# Patient Record
Sex: Male | Born: 1960 | Race: White | Hispanic: No | Marital: Single | Smoking: Never smoker
Health system: Southern US, Community
[De-identification: ages and names within clinical notes are randomized; demographics above are authoritative.]

## PROBLEM LIST (undated history)

## (undated) DIAGNOSIS — N183 Chronic kidney disease, stage 3 unspecified: Secondary | ICD-10-CM

## (undated) DIAGNOSIS — E785 Hyperlipidemia, unspecified: Secondary | ICD-10-CM

## (undated) DIAGNOSIS — I255 Ischemic cardiomyopathy: Secondary | ICD-10-CM

## (undated) DIAGNOSIS — I10 Essential (primary) hypertension: Secondary | ICD-10-CM

## (undated) DIAGNOSIS — I5042 Chronic combined systolic (congestive) and diastolic (congestive) heart failure: Secondary | ICD-10-CM

## (undated) DIAGNOSIS — I2510 Atherosclerotic heart disease of native coronary artery without angina pectoris: Secondary | ICD-10-CM

## (undated) HISTORY — DX: Atherosclerotic heart disease of native coronary artery without angina pectoris: I25.10

## (undated) HISTORY — DX: Ischemic cardiomyopathy: I25.5

## (undated) HISTORY — DX: Hyperlipidemia, unspecified: E78.5

## (undated) HISTORY — DX: Chronic kidney disease, stage 3 unspecified: N18.30

## (undated) HISTORY — PX: VEIN BYPASS SURGERY: SHX833

## (undated) HISTORY — PX: OTHER SURGICAL HISTORY: SHX169

## (undated) HISTORY — DX: Chronic kidney disease, stage 3 (moderate): N18.3

## (undated) HISTORY — DX: Essential (primary) hypertension: I10

---

## 1990-05-06 HISTORY — PX: KIDNEY SURGERY: SHX687

## 1997-11-15 ENCOUNTER — Emergency Department (HOSPITAL_COMMUNITY): Admission: EM | Admit: 1997-11-15 | Discharge: 1997-11-15 | Payer: Self-pay | Admitting: Emergency Medicine

## 1998-10-12 ENCOUNTER — Inpatient Hospital Stay (HOSPITAL_COMMUNITY): Admission: EM | Admit: 1998-10-12 | Discharge: 1998-10-13 | Payer: Self-pay | Admitting: Emergency Medicine

## 1998-10-12 ENCOUNTER — Encounter: Payer: Self-pay | Admitting: Emergency Medicine

## 1998-10-13 ENCOUNTER — Encounter: Payer: Self-pay | Admitting: Cardiology

## 2005-01-14 ENCOUNTER — Other Ambulatory Visit: Payer: Self-pay

## 2005-01-14 ENCOUNTER — Emergency Department: Payer: Self-pay | Admitting: Emergency Medicine

## 2007-05-07 HISTORY — PX: CARDIAC CATHETERIZATION: SHX172

## 2007-07-07 ENCOUNTER — Inpatient Hospital Stay: Payer: Self-pay | Admitting: *Deleted

## 2007-07-08 ENCOUNTER — Inpatient Hospital Stay (HOSPITAL_COMMUNITY): Admission: AD | Admit: 2007-07-08 | Discharge: 2007-07-14 | Payer: Self-pay | Admitting: Cardiology

## 2007-07-09 ENCOUNTER — Ambulatory Visit: Payer: Self-pay | Admitting: Thoracic Surgery (Cardiothoracic Vascular Surgery)

## 2007-08-25 ENCOUNTER — Encounter
Admission: RE | Admit: 2007-08-25 | Discharge: 2007-08-25 | Payer: Self-pay | Admitting: Thoracic Surgery (Cardiothoracic Vascular Surgery)

## 2007-08-25 ENCOUNTER — Ambulatory Visit: Payer: Self-pay | Admitting: Thoracic Surgery (Cardiothoracic Vascular Surgery)

## 2008-06-17 IMAGING — CR DG CHEST 2V
2 series · 2 of 2 positions shown · non-contrast
Comparison: 07/11/2007

CLINICAL DATA: Unstable angina.  CABG.

CHEST - 2 VIEW

[w chest pa]
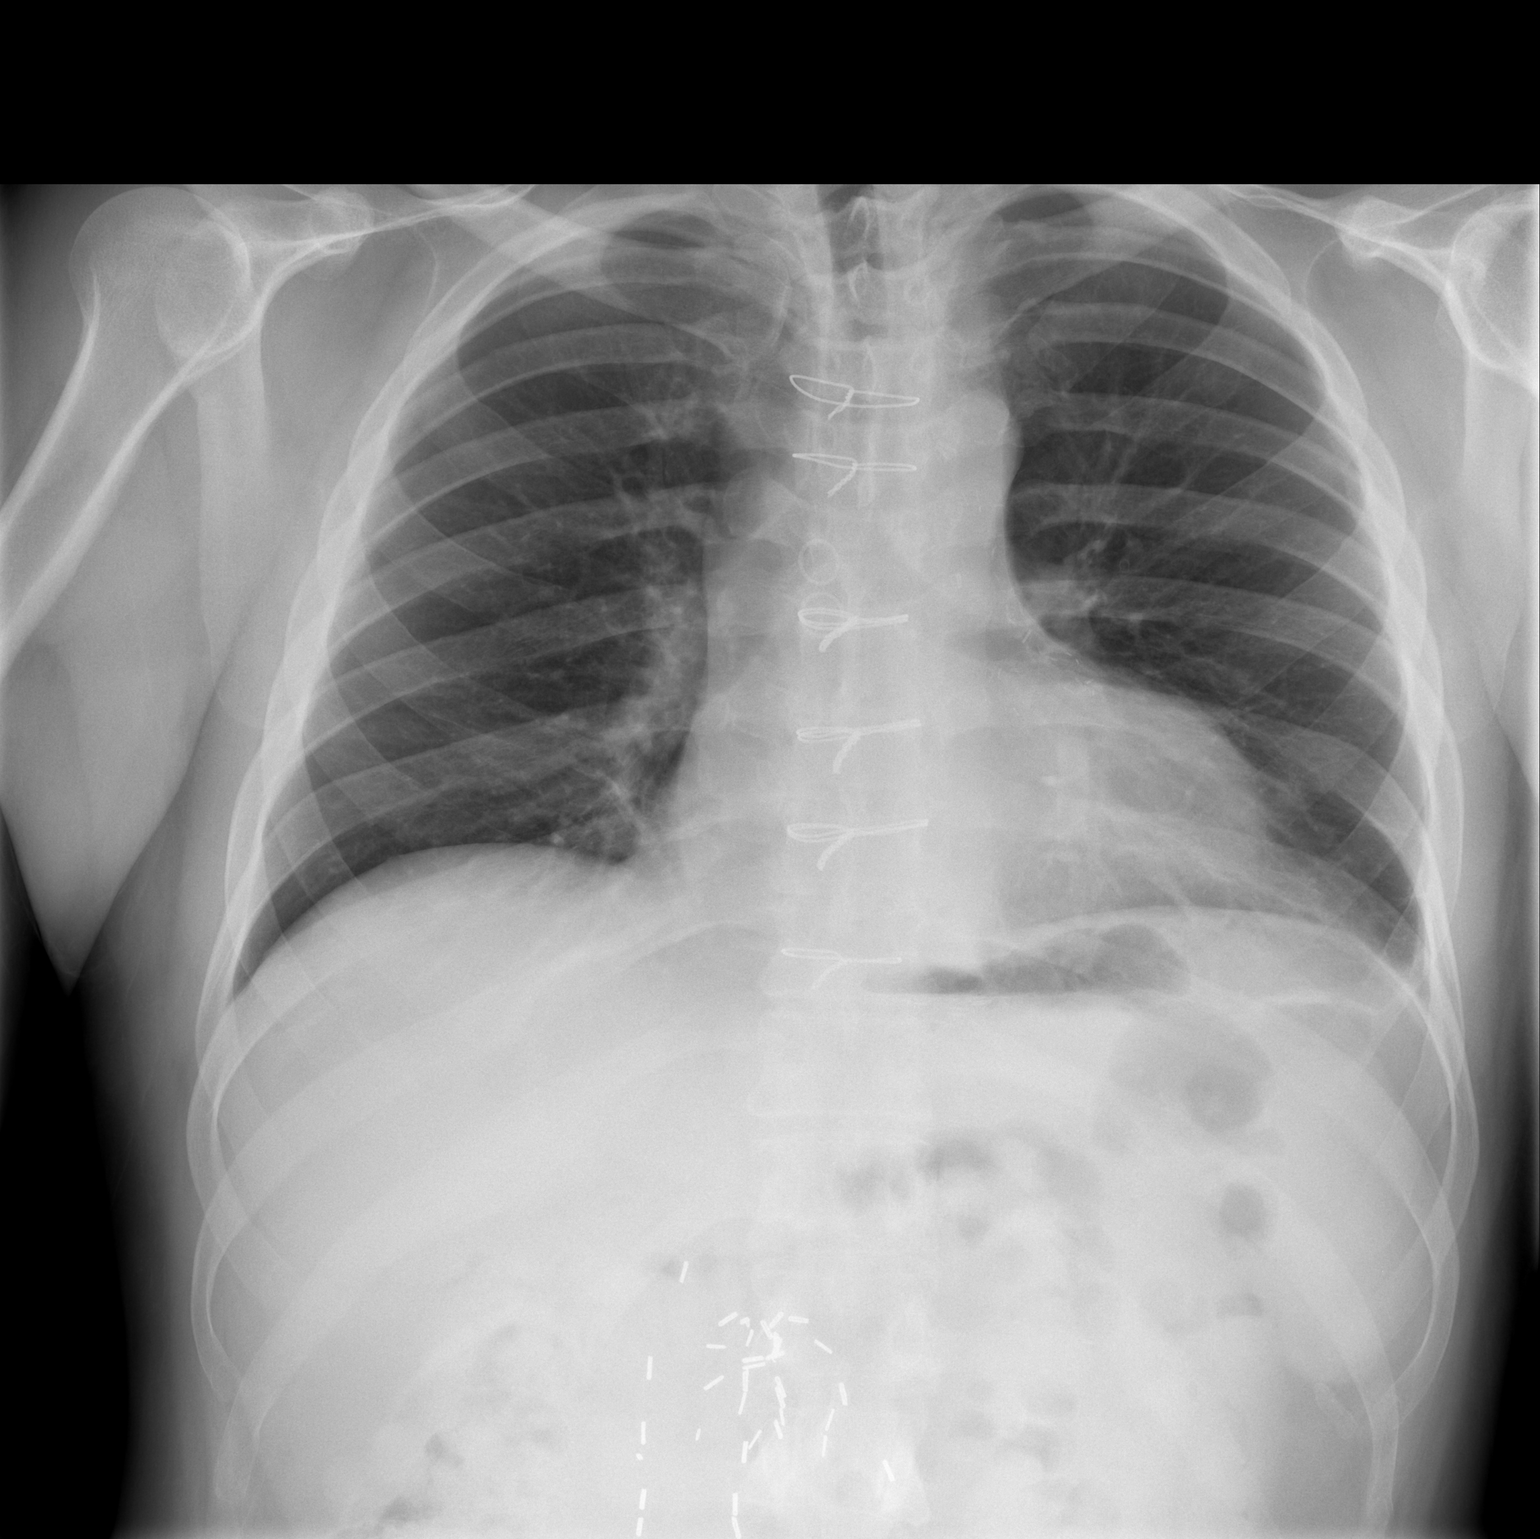

[w chest lat]
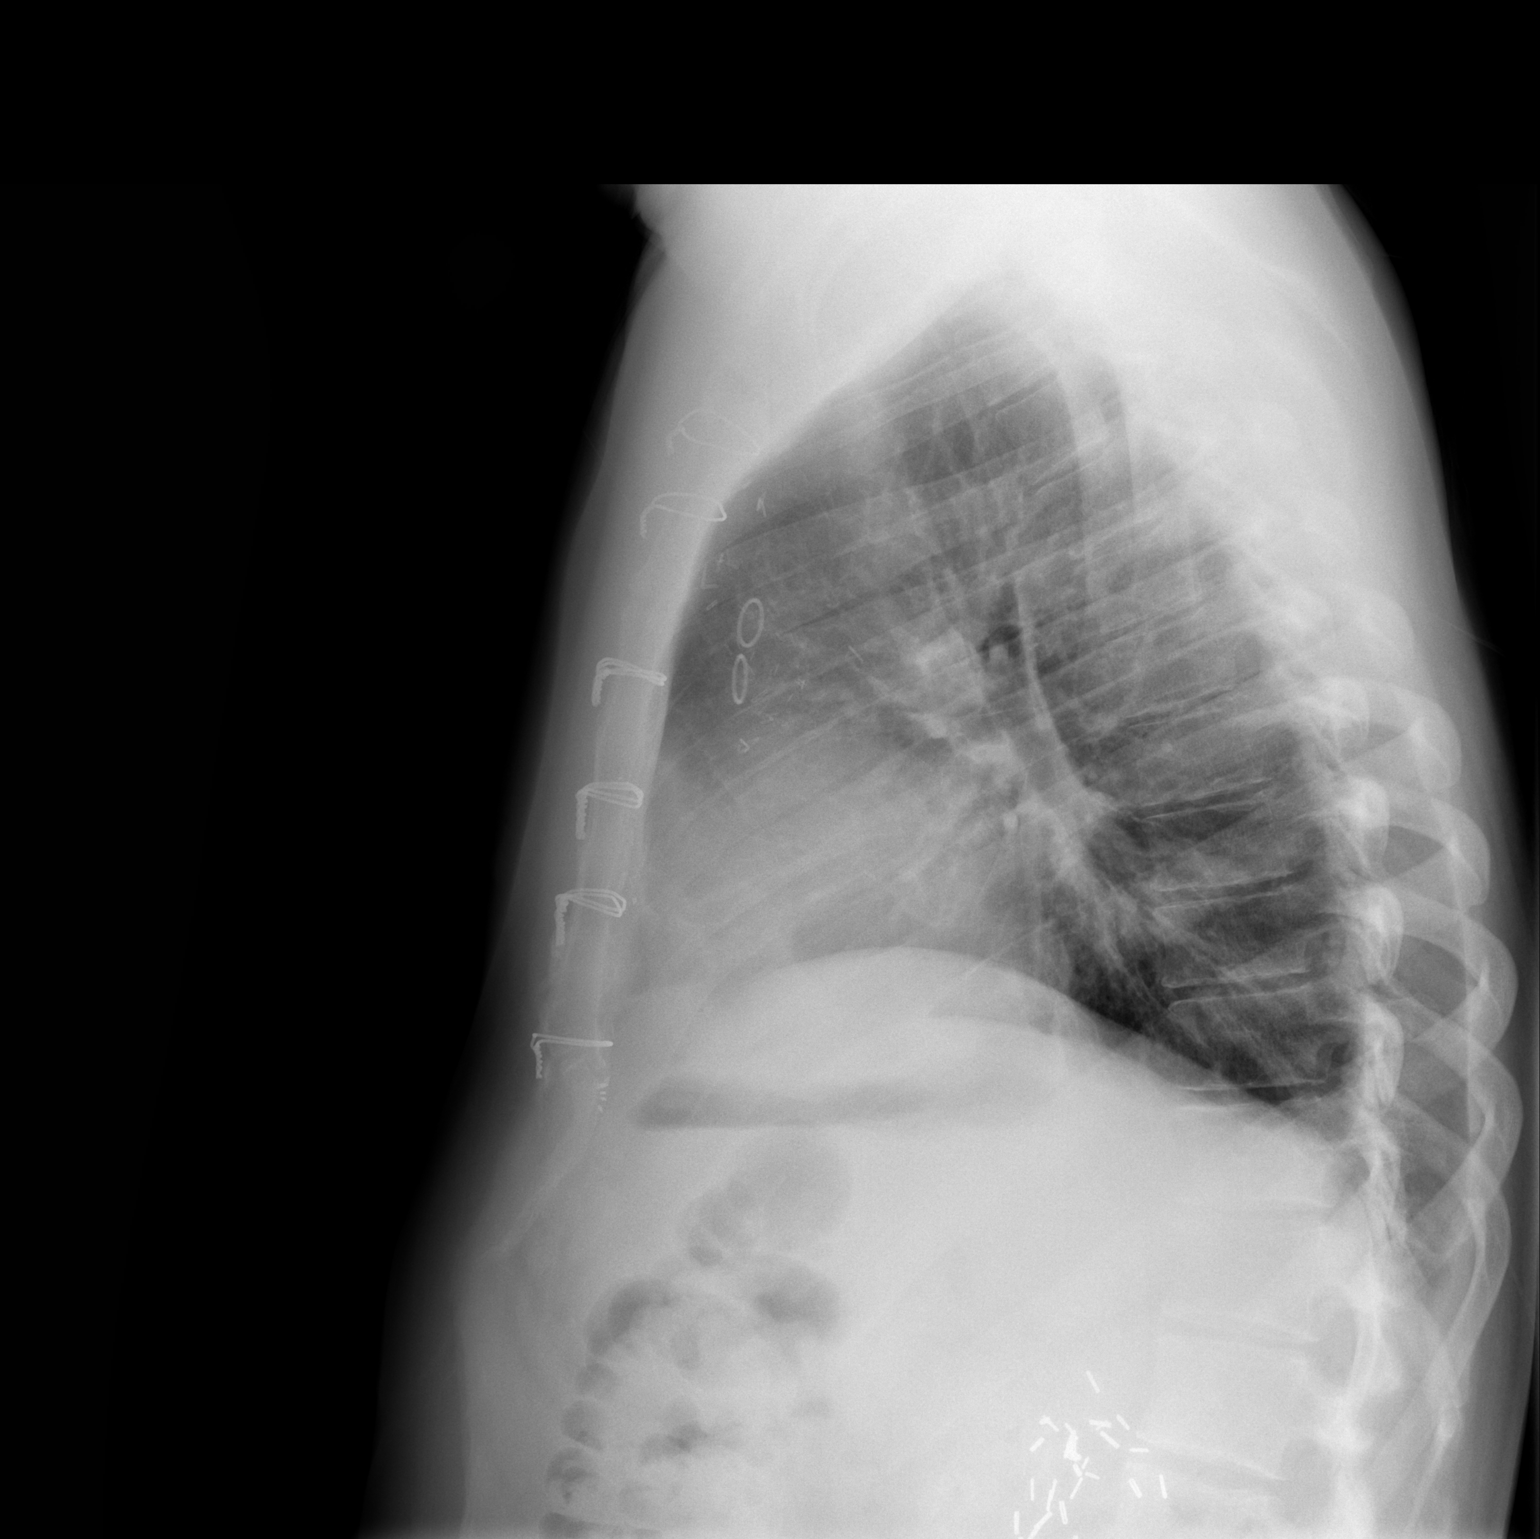

[2 of 2 positions shown; findings below may reference images not displayed]

FINDINGS: Prior median sternotomy and CABG.  Extensive surgical
changes in the upper abdomen.

Midline trachea.  Heart size normal.  No pleural effusion or
pneumothorax.  Minimal left base atelectasis or scar remains.
IMPRESSION: 1.  No acute cardiopulmonary disease.
2.  Minimal left base scar or atelectasis.

## 2008-07-14 ENCOUNTER — Inpatient Hospital Stay: Payer: Self-pay | Admitting: Internal Medicine

## 2010-09-18 NOTE — Op Note (Signed)
Tommy Kelly, Tommy Kelly               ACCOUNT NO.:  [id number]   MEDICAL RECORD NO.:  [id number]          PATIENT TYPE:  INP   LOCATION:  2311                         FACILITY:  [facility]   PHYSICIAN:  Guadalupe Maple, M.D.  DATE OF BIRTH:  1961-02-11   DATE OF PROCEDURE:  07/08/2007  DATE OF DISCHARGE:                               OPERATIVE REPORT   PROCEDURE:  Intraoperative transesophageal echocardiography.   HISTORY:  This is a 50 year old white male who has a history of coronary  artery disease and has had multiple cardiac stents in the past who  presented with worsening chest pain and positive troponin levels and  scheduled for emergent coronary artery bypass grafting.  Because no  assessment of his left ventricular function had been performed,  intraoperative transesophageal echocardiography was requested to  evaluate left ventricular function to serve as a monitor for  intraoperative volume status and determine if any valvular pathology was  present.   The patient was brought to the operating room at [facility] and  general anesthesia was induced by Dr. Hart Robinsons.  Following  induction and incision, the transesophageal echocardiography probe was  then inserted into the esophagus by me.   IMPRESSION:  Pre bypass findings:  1. Left ventricle.  The left ventricular cavity revealed akinesis at      the apex and distal anterior wall but good contractility in other      segments interrogated.  The ejection fraction was estimated at 45-      50%.  There was mild left ventricular hypertrophy with left      ventricular wall thickness of 1.1 to 1.2 and end diastole at the      mid papillary level.  There was suspicion for possible mass at the      apex which could not be well visualized but could possibly      represent a partially calcified or organized left ventricular mural      thrombus at the apex.  This did not appear mobile and did not      appear to be fresh.  2. Aortic valve.  The aortic valve was trileaflet and opened normally.      There was no aortic insufficiency.  3. Mitral valve.  The mitral valve leaflets opened normally without      prolapse or fluttering.  There was trace mitral insufficiency.  The      leaflets were thin and pliable.  4. Right ventricle.  The right ventricular size was normal.  There was      good contractility of the right ventricular free wall.  5. Tricuspid valve.  The tricuspid valve appeared structurally intact.      There was 1+ tricuspid insufficiency noted.  6. Interatrial septum.  The interatrial septum was intact.  There was      no evidence of atrial septal defect by color Doppler or bubble      study.  7. Left atrium.  The left atrial size appeared to be within normal      limits.  There was no thrombus noted in  the left atrium or left      atrial appendage.  8. Ascending aorta.  The ascending aorta appeared to have normal      contour with a well-defined aortic root and no significant      atheromatous disease noted in the descending aorta.  9. Descending aorta. The descending aorta showed no significant      atheromatous disease noted with a diameter of 2.1 cm.   Post bypass findings:  1. Left ventricle. Left ventricular function appeared unchanged from      the pre bypass study.  There was good contractility in all areas      except for the distal anterior wall and apex which appeared to be      akinetic.  Again there was noted to be an area in the apex that      appeared suspicious for a possible mass and/or calcified left      ventricular apical thrombus.  Ejection fraction was again estimated      at 45-50%.  2. Aortic valve.  The aortic valve was unchanged from prebypass study.      It opened normally.  There was no aortic insufficiency.  3. Mitral valve.  The mitral valve again showed a normal opening and      trace mitral insufficiency.  4. Right ventricle. The right ventricular function  appeared normal.      There was good contractility of the right ventricular free wall.           ______________________________  Guadalupe Maple, M.D.     DCJ/MEDQ  D:  07/08/2007  T:  07/09/2007  Job:  [id number]   cc:   Guadalupe Maple, M.D.

## 2010-09-18 NOTE — Op Note (Signed)
NAMESEENA, FACE               ACCOUNT NO.:  [id number]   MEDICAL RECORD NO.:  [id number]          PATIENT TYPE:  INP   LOCATION:  2311                         FACILITY:  [facility]   PHYSICIAN:  Salvatore Decent. Dorris Fetch, M.D.DATE OF BIRTH:  04-04-1961   DATE OF PROCEDURE:  07/08/2007  DATE OF DISCHARGE:                               OPERATIVE REPORT   PREOPERATIVE DIAGNOSES:  Critical two-vessel disease with unstable  postinfarct angina.   POSTOPERATIVE DIAGNOSIS:  Critical two-vessel disease with unstable  postinfarct angina.   PROCEDURE:  Emergency median sternotomy, extracorporeal circulation,  coronary artery bypass grafting x4 (left internal mammary artery to LAD,  saphenous vein graft to distal LAD, sequential saphenous vein graft to  obtuse marginal and left posterior descending), endoscopic vein harvest  right leg.   SURGEON:  Salvatore Decent. Dorris Fetch, M.D.   ASSISTANT:  Stephanie Acre. Dominick, PA   ANESTHESIA:  General.   FINDINGS:  Poor quality target vessels, good quality conduits, apical  scar, posterior lateral scar.  Transesophageal echocardiography revealed  preserved basilar wall motion, apical akinesis.  No apical clot  identified with intraoperative TEE.   CLINICAL NOTE:  Tommy Kelly is a 50 year old gentleman with a  longstanding history of coronary disease who recently has been off  medication due to financial issues.  He has had about a 3-week history  of crescendo chest pain, with acute exacerbation over the past 48 hours.  He was admitted to [facility] and ruled in for  myocardial infarction with a troponin of 8.  He continued to have  stuttering chest pain.  At cardiac catheterization he had severe 2-  vessel disease and a left dominant circulation.  The LAD was subtotally  occluded with TIMI I flow.  A previously stented obtuse marginal one was  totally occluded with left-to-left collaterals.  There also was  significant disease in the  proximal circumflex which is a dominant  vessel supplying multiple small posterior lateral branches.  The right  coronary was nondominant.  The patient was transferred to [facility]  where he continued to have chest pain.  I was consulted, met with the  patient, and advised him to undergo urgent coronary artery bypass  grafting.  The patient was pain free at the time of my examination.  Mr.  Kelly understood and accepted the risks of surgery and agreed to  proceed.   OPERATIVE NOTE:  Tommy Kelly was brought to the operating room on July 08, 2007.  There the Anesthesia Service under the direction of Dr. Kipp Brood placed an arterial blood pressure monitoring catheter as well as  a Swan-Ganz catheter.  The patient was anesthetized and intubated.  Transesophageal echocardiography was performed that revealed preserved  basilar wall motion, apical akinesis.  There was no significant valvular  pathology.  Please refer to Dr. Morley Kos dictated note for further  details.  The chest, abdomen and legs were prepped and draped in the  usual fashion.  Incision was made in the medial aspect of the right leg  at the level of the knee.  The greater saphenous vein  was identified and  was harvested endoscopically.  It was a good quality vessel.  Simultaneously a median sternotomy was performed.  The left internal  mammary artery was harvested using the standard technique.  It was a  good quality vessel.  No heparin was given during the vessel harvest.  The patient received Plavix, Integrilin and heparin which were  discontinued shortly before initiating the operation.  There was  excellent flow through the mammary artery.   The pericardium was opened.  The ascending aorta was inspected.  There  was no evidence of atherosclerotic disease.  The aorta was cannulated  via concentric 2 Ethibond pledgeted pursestring sutures.  A dual stage  venous cannula was placed via a pursestring suture in the right  atrial  appendage.  Cardiopulmonary bypass was instituted and the patient was  cooled to 32 degrees Celsius.  The coronary arteries were inspected.  They were diffusely diseased, poor quality targets, particularly the LAD  which had been poorly visualized on catheterization was extremely  diffusely diseased.  The conduits were inspected and cut to length.  A  foam pad was placed in the pericardium to insulate the heart and protect  the left phrenic nerve.  A temperature probe was placed in the  myocardial septum.  A retrograde cardioplegia cannula placed via  pursestring suture and directed into the coronary sinus.  Antegrade  cardioplegic cannula was placed in the ascending aorta.   The aorta was crossclamped, the left ventricle was emptied via the  aortic root vent.  Cardiac arrest then was achieved with a combination  of cold antegrade and retrograde blood cardioplegia and topical iced  saline.  After achieving a complete diastolic arrest and adequate  myocardial septal cooling, the following distal anastomoses were  performed.   First, a reverse saphenous vein graft was placed sequentially to obtuse  marginal one as well as the posterior descending branch of the left  circumflex, this was the largest of the distal branches and the only one  that was graftable.  It was a 1 mm vessel.  OM-1 was a 1.3 mm vessel.  It was diffusely diseased.  It was totally occluded proximally, it was  intramyocardial at the site of the anastomosis.  Side-to-side  anastomosis was performed to OM-1 and end-to-side to the distal  posterior descending.  Both anastomoses were probed proximally and  distally at their completion to ensure patency.  Cardioplegia was  administered.  There was bleeding from the heel of the side-to-side  anastomosis to OM-1.  This was repaired with a 7-0 Prolene suture.  There then was good hemostasis.  There was good flow through the graft.   Next, the distal LAD was opened.   An arteriotomy was made.  There was  significant disease throughout the midportion of the LAD.  A 1.5 mm  probe did however pass through the apex.  A 1 mm probe passed  proximally.  A 1.5 mm probe passed a short distance.  Because this area  of the heart had been previously damaged, it was elected to place a vein  graft to this area and then use the mammary artery to the more proximal  segment of the LAD.  As noted, a 1 mm probe would pass between the two  segments but a 1.5 mm probe would not, consistent with significant  stenosis.  The vein graft was anastomosed end-to-side to the distal LAD  with running 7-0 Prolene suture.  There was good flow and good  hemostasis.   Next, the left internal mammary artery was brought through a window in  the pericardium.  The distal end was beveled.  It was a 2.5 mm good  quality target.  It ws anastomosed end-to-side to the proximal LAD.  The  LAD here was about a 1.8 mm vessel and accepted a 1.5 mm probe which  passed easily proximally but would not pass a short distance distally.  The mammary was anastomosed to the LAD with a running 8-0 Prolene  suture.  Bulldog clamps removed.  Immediate and rapid septal rewarming  was noted.  The bulldog clamp was replaced.  The mammary pedicle was  tacked to the epicardial surface of the heart with 6-0 Prolene sutures.   Additional cardioplegia was administered.  The vein grafts were cut to  length.  The proximal vein graft anastomoses were performed through 4.5  mm punch aortotomies while under crossclamp with running 6-0 Prolene  sutures.  At the completion of the final proximal anastomosis, the  patient was placed in Trendelenburg position.  Lidocaine was  administered.  The bulldog clamp was again removed from the left mammary  artery.  The aortic root was de-aired and the aortic crossclamp was  removed.  The total crossclamp time was 64 minutes.   A test dose of protamine was administered and was well  tolerated.  The  atrial and aortic cannulae were removed.  The remainder of the protamine  was administered without incident.  Post-bypass transesophageal  echocardiography showed again preserved basilar wall motion.  The  pericardium was reapproximated with interrupted 3-0 silk sutures that  came together easily without tension. A left pleural and 2 mediastinal  chest tubes were placed through separate subcostal incisions.  The  sternum was closed with interrupted heavy gauge stainless steel wires.  The pectoralis fascia, subcutaneous tissue and skin were closed in the  standard fashion.  All sponge, needle and instrument counts were correct  at the end of the procedure.  There were no intraoperative  complications.  The patient was taken from the operating room to the  surgical intensive care unit in critical but stable condition.      Salvatore Decent Dorris Fetch, M.D.  Electronically Signed     SCH/MEDQ  D:  07/08/2007  T:  07/09/2007  Job:  [id number]   cc:   Thereasa Solo. Little, M.D.

## 2010-09-18 NOTE — Discharge Summary (Signed)
Tommy Kelly, Tommy Kelly               ACCOUNT NO.:  [id number]   MEDICAL RECORD NO.:  [id number]          PATIENT TYPE:  INP   LOCATION:  2016                         FACILITY:  [facility]   PHYSICIAN:  Salvatore Decent. Dorris Fetch, M.D.DATE OF BIRTH:  01-04-1961   DATE OF ADMISSION:  07/08/2007  DATE OF DISCHARGE:  07/13/2007                               DISCHARGE SUMMARY   PRIMARY ADMITTING DIAGNOSES:  1. Coronary artery disease.  2. Unstable post infarct angina.   ADDITIONAL/DISCHARGE DIAGNOSES:  1. Critical two vessel coronary artery disease.  2. Unstable post infarct angina.  3. History of coronary artery disease status post multiple stent      placements.  4. Postoperative atrial fibrillation.  5. Hypertension.  6. Hyperlipidemia.  7. History of renal artery stenosis, status post right nephrectomy.  8. Mild postoperative blood loss anemia.   PROCEDURES PERFORMED:  1. Emergency coronary artery bypass grafting x4 (left internal mammary      artery to the LAD, saphenous vein graft to the distal LAD,      sequential saphenous vein graft to the obtuse marginal and left      posterior descending).  2. Endoscopic vein harvest right leg.   HISTORY:  The patient is a 50 year old male with a known history of  coronary artery disease who is status post multiple stent placements in  the past.  He has recently been off all his medications due to financial  issues.  He had been in his usual state of health until approximately  three weeks ago.  At that point, he began to experience intermittent  chest pain which has progressively worsened.  On the July 06, 2007, he  developed significant chest pain overnight which radiated to his arms,  neck and back.  This was associated with dizziness, nausea and  palpitations.  He was seen in the emergency department at [facility] and was subsequently admitted and ruled in for  myocardial infarction.  He continued to have stuttering chest  pain  following his admission.  He underwent cardiac catheterization which  showed severe two-vessel coronary artery disease with a subtotally  occluded LAD with TIMI flow, a previously stented obtuse marginal one  was totally occluded with left-to-left collaterals.  There was also  significant disease in the proximal circumflex and there was a  nondominant right coronary artery.  Because of all these findings, he  was transferred to [facility] for further evaluation and  treatment.   HOSPITAL COURSE:  Upon admission to [facility], the patient had  continued to have ongoing chest pain despite Integrilin and  nitroglycerin drips.  He was seen by [facility] and a  cardiothoracic surgery consultation was recommended.  Dr. Charlett Lango saw the patient and reviewed his films and agreed that an  emergency coronary artery bypass surgery would be his best course of  action.  He explained the risks, benefits and alternatives of the  procedure to the patient and he agreed to proceed.  He was taken  immediately to the operating room and underwent CABG x4 as described in  detail above.  He tolerated the procedure well and was transferred to  the SICU in stable condition.  He was able to be extubated shortly after  surgery.  He was hemodynamically stable and doing well on postop day #1.  He was restarted on Plavix for stent in his left circumflex.  He was  also started on a beta blocker and a Statin.  His chest tubes and  hemodynamic monitoring devices were removed and he was kept in the unit  for further observation.   On postop day #2, he developed rapid atrial fibrillation and was started  on an amiodarone drip.  He converted to normal sinus rhythm and was able  to be transferred to the [facility].  He continued to have intermittent atrial  fibrillation but by late in the day on postop day #3, he stabilized to  normal sinus rhythm.  Since that time he has  maintained sinus rhythm  with heart rates in the 70s to 90s.  Otherwise, his postoperative course  been uneventful.  He has been volume overloaded and has been started on  Lasix to which he is responding well.  His incisions are all healing  well.  He has been afebrile and vital signs have been stable.  His blood  pressures have remained stable, running around 110-120 systolic.  He is  ambulating the halls without difficulty.  He is maintaining O2 sats of  greater than 90% on room air.  He has been switched to p.o. amiodarone.   LABORATORY DATA:  His most recent labs show hemoglobin of 9.7,  hematocrit 28.9, platelets 185, white count 12.1.  Sodium 132, potassium  4.7, BUN 25, creatinine 1.22.   His chest x-rays remained stable with mild bibasilar atelectasis.   It is felt that if he continues to remain stable over the next 24 hours,  he will hopefully be ready for discharge home on July 13, 2007.   DISCHARGE MEDICATIONS:  1. Enteric-coated aspirin 325 mg daily.  2. Lopressor 25 mg b.i.d.  3. Lipitor 80 mg nightly.  4. Plavix 75 mg daily.  5. Amiodarone 400 mg b.i.d. x1 week, then decrease to 200 mg b.i.d.  6. Lasix 40 mg daily x1 week.  7. K-Dur 20 mEq daily x1 week.  8. Tylox one to two q.4h. p.r.n. for pain.   DISCHARGE INSTRUCTIONS:  He is asked to refrain from driving, heavy  lifting or strenuous activity.  He may continue ambulating daily and  using his incentive spirometer.  He may shower daily and clean his  incisions with soap and water.  He will continue low-fat, low-sodium  diet.   DISCHARGE FOLLOWUP:  He will need to make an appointment to see Dr.  Clarene Duke in two weeks.  He will then see Dr. Dorris Fetch in three weeks  with a chest x-ray from [facility].  In the interim, if he  experiences problems or has questions, he is asked to contact our office  immediately.      Coral Ceo, P.A.      Salvatore Decent Dorris Fetch, M.D.  Electronically Signed     GC/MEDQ  D:  07/12/2007  T:  07/13/2007  Job:  [id number]   cc:   Thereasa Solo. Little, M.D.  [facility] office

## 2010-09-18 NOTE — Consult Note (Signed)
NAMEJADON, HARBAUGH               ACCOUNT NO.:  [id number]   MEDICAL RECORD NO.:  [id number]          PATIENT TYPE:  INP   LOCATION:  2904                         FACILITY:  [facility]   PHYSICIAN:  Salvatore Decent. Dorris Fetch, M.D.DATE OF BIRTH:  1960/06/29   DATE OF CONSULTATION:  07/08/2007  DATE OF DISCHARGE:                                 CONSULTATION   REASON FOR CONSULTATION:  Severe two-vessel disease with ongoing chest  pain.   HISTORY OF PRESENT ILLNESS:  Mr. Cazarez is a 50 year old gentleman with  a past history of coronary disease who has had previous stents dating  back to 44.  His last stent was placed in 2007 in [place].  His  other medical history is significant for hypertension, hyperlipidemia  and renal artery stenosis.  He had been laid off from work and has not  been taking any medications recently.  Over the past 3 months, he has  been having significant chest pain, and then over the past 3 weeks it  had gotten significantly worse.  Finally, within the past 48 hours, it  became so severe he could no longer stand it, and he came to the  hospital.  He describes this as a severe pain throughout his chest which  radiates to his arms, neck and back.  He also was having dizziness,  diaphoresis and nausea, as well as palpitations.  He was seen in the  emergency room at [facility], treated with nitroglycerin.  His enzymes  were positive.  He was taken to the cardiac catheterization laboratory.  He has a left dominant circulation.  He had severe two-vessel disease  with subtotal occlusion of his LAD. as well as first OM which is a  previously stented vessel.  The patient has continued to have chest pain  post catheterization, although currently, following morphine and  nitroglycerin, he denies any pain at the present time.   PAST MEDICAL HISTORY:  1. Significant for coronary artery disease, previous PTCA and      stenting.  2. Hypertension.  3. Hyperlipidemia.  4. Renal  artery stenosis status post right nephrectomy.   MEDICATIONS:  Prior to admission, he was not taking medications due to  financial issues.  He had previously been on Lipitor and Plavix.   ALLERGIES:  1. INTRAVENOUS DYE.  2. PEANUTS.   FAMILY HISTORY:  Significant for cardiac history; father died at age 75.   SOCIAL HISTORY:  Currently is laid off from work.  He denies tobacco use  and alcohol use.   REVIEW OF SYSTEMS:  See HPI.  All other systems are negative.   PHYSICAL EXAMINATION:  GENERAL:  Mr. Cifelli is a well-appearing 50-year-  old white male in no acute distress.  VITAL SIGNS:  His blood pressure is 110/70, pulse 70 and regular,  respirations 16.  GENERAL:  He is well-developed and well-nourished.  NEUROLOGICAL:  He is alert and oriented x3 and grossly intact.  CARDIAC:  Regular rate and rhythm.  Normal S1 and S2.  No murmurs or  rubs.  LUNGS:  Clear with equal breath sounds bilaterally.  EXTREMITIES:  Without clubbing, cyanosis or edema.  He does have  palpable pulses.   LABORATORY DATA:  Sodium 136, potassium 3.9, BUN and creatinine 15 and  1.17.  White count 8.3, hematocrit 46, platelets 260.  EKG shows  nonspecific ST changes laterally and an old anterior MI.   Mr. Vera is a 50 year old gentleman with severe two-vessel disease  and a left dominant circulation.  Coronary artery bypass grafting is  indicated for myocardial preservation benefits, as well as relief of  symptoms.  I have discussed in detail with the patient and his ex-wife  who is present the indications, risks, benefits and alternative  treatments.  They understand the risks include, but are not limited to,  death, stroke, MI, DVT, PE, bleeding, possible need for transfusions,  infections, as well as other organ system dysfunction including  respiratory, renal, hepatic or GI complications.  He understands,  accepts these risks and agrees to proceed.  He does understand that he  is at particularly  high risk for bleeding complications, given that he  has been given Plavix, Integrilin and heparin.  All the patient's  questions were answered.  There OR has been notified, and we will  proceed as soon as possible.      Salvatore Decent Dorris Fetch, M.D.  Electronically Signed     SCH/MEDQ  D:  07/08/2007  T:  07/09/2007  Job:  [id number]

## 2010-09-18 NOTE — Discharge Summary (Signed)
NAMEKOLLEN, ARMENTI               ACCOUNT NO.:  [id number]   MEDICAL RECORD NO.:  [id number]          PATIENT TYPE:  INP   LOCATION:  2024                         FACILITY:  [facility]   PHYSICIAN:  Salvatore Decent. Dorris Fetch, M.D.DATE OF BIRTH:  05-19-1960   DATE OF ADMISSION:  07/08/2007  DATE OF DISCHARGE:  07/14/2007                               DISCHARGE SUMMARY   ADDENDUM  Mr. Carmie Kanner was originally scheduled for discharge home on July 13, 2007.  However, at that time, he was noted to have some serous drainage from  the distal sternal wound and cellulitis of old IV site on his right  hand.  Because of this, he was started on antibiotics and was kept 1  additional day for observation.  During that time, he remained stable.  He continued in sinus rhythm.  On exam, on July 14, 2007, he was noted  to have improvement in his cellulitis as well as decrease in the  drainage from the sternal wound.  His sternum was otherwise stable.   His labs on postop day #5 show hemoglobin of 9.9, hematocrit 29.5,  platelets 267, white count 10.1, sodium 134, potassium 4.2, BUN 15,  creatinine 1.09.  He was otherwise doing well and was able to be  discharged home on July 14, 2007.   Discharge medications are unchanged from the previously dictated  discharge summary with the addition of Keflex 500 mg t.i.d. x1 week and  Lipitor was discontinued, and he was started on Pravachol 40 mg nightly  by the cardiologist.  Discharge instructions and followup are unchanged  from the previously dictated discharge summary.      Coral Ceo, P.A.      Salvatore Decent Dorris Fetch, M.D.  Electronically Signed    GC/MEDQ  D:  08/17/2007  T:  08/18/2007  Job:  [id number]

## 2010-09-18 NOTE — Assessment & Plan Note (Signed)
OFFICE VISIT   Tommy Kelly, Tommy Kelly  DOB:  Sep 07, 1960                                        August 25, 2007  CHART #:  [id number]   HISTORY:  Tommy Kelly is a 50 year old gentleman with previous history  of coronary disease and multiple previous stent placements.  He had  emergency coronary bypass grafting x4 on July 06, 2007.  At that time,  he was noted to have poor target vessels.  His LAD was grafted in 2  places as well as a vein graft to an obtuse marginal and left posterior  descending.  Postoperatively, he had some atrial fibrillation that  subsequently resolved.  He was discharged home on postoperative day  number 5.  Since that time, he has been having incisional pain.  He is  not taking any narcotics because he said it was too expensive.  He has  been taking Tylenol, but still does have significant pain.  He was  trying to take some Benadryl for sleep, but had the opposite effect and  actually kept him up all night.  So he stopped taking that.  He has not  had any anginal-type chest pain.   PHYSICAL EXAMINATION:  GENERAL:  Tommy Kelly is a 50 year old white male  in no acute distress.  VITAL SIGNS:  Blood pressure 152/93, pulse 57, respirations 18, his  oxygen saturation is 97% on room air.  LUNGS:  Equal breath sounds bilaterally.  CARDIAC:  Regular rate and rhythm, normal S1 and S2.  Sternal incision  is clean, dry and intact.  Sternum is stable.  EXTREMITIES:  There is peripheral edema.   DIAGNOSTICS:  Chest x-ray shows minimal left base scarring, otherwise no  active disease.   IMPRESSION:  Tommy Kelly is a 50 year old gentleman who had emergency  coronary bypass grafting.  He is doing well at this point in time.  He  is about 6 weeks out from surgery.  I gave him a prescription for  Oxycodone 1-2 three times daily as needed for pain.  I think if we can  break his pain cycle, it should start to resolve.  I was unable to find  any  medications on the [facility] $4 list that I was comfortable using.  He  is not a good candidate for strong nonsteroidals given his single  kidney.  He had a previous right nephrectomy, but I did encourage him to  call to see if he could get a generic at a reduced price.  I think he is  at least 6 weeks out from being able to return to work.  He is still on  Plavix.  He had questions about Plavix.  I initially told him it would  be okay to discontinue that.  However, he does have a stent in his left  circumflex that is still patent and therefore probably should remain on  the Plavix if he is able to financially.  From a surgical standpoint, he  is doing quite well.   FOLLOW UP:  1. He will continue to be followed by Dr. Mariah Milling.  2. I would be happy to see him back any time if I could be of any      further assistance with his care.   Salvatore Decent Dorris Fetch, M.D.  Electronically Signed   SCH/MEDQ  D:  08/25/2007  T:  08/25/2007  Job:  [id number]   cc:   Antonieta Iba, MD

## 2011-01-28 LAB — BASIC METABOLIC PANEL
BUN: 13
BUN: 16
CO2: 22
CO2: 26
Calcium: 8.4
Calcium: 8.5
Chloride: 98
Creatinine, Ser: 1.25
GFR calc Af Amer: >60
GFR calc non Af Amer: 55 — ABNORMAL LOW
GFR calc non Af Amer: >60
GFR calc non Af Amer: >60
Glucose, Bld: 114 — ABNORMAL HIGH
Glucose, Bld: 115 — ABNORMAL HIGH
Glucose, Bld: 125 — ABNORMAL HIGH
Potassium: 4.2
Potassium: 4.7
Sodium: 132 — ABNORMAL LOW
Sodium: 135
Sodium: 141

## 2011-01-28 LAB — COMPREHENSIVE METABOLIC PANEL
ALT: 32
AST: 42 — ABNORMAL HIGH
Albumin: 3.4 — ABNORMAL LOW
CO2: 23
Calcium: 8.9
Creatinine, Ser: 1
GFR calc Af Amer: >60
Sodium: 136

## 2011-01-28 LAB — I-STAT 8, (EC8 V) (CONVERTED LAB)
BUN: 22
Chloride: 104
Glucose, Bld: 137 — ABNORMAL HIGH
HCT: 30 — ABNORMAL LOW
Hemoglobin: 10.2 — ABNORMAL LOW
Operator id: 288291
Sodium: 138
pCO2, Ven: 50

## 2011-01-28 LAB — CBC
HCT: 28.9 — ABNORMAL LOW
HCT: 29.5 — ABNORMAL LOW
Hemoglobin: 10.5 — ABNORMAL LOW
Hemoglobin: 10.5 — ABNORMAL LOW
Hemoglobin: 10.8 — ABNORMAL LOW
Hemoglobin: 9.7 — ABNORMAL LOW
MCHC: 33.6
MCHC: 34
MCHC: 34.5
MCHC: 35.2
MCV: 87.2
MCV: 87.3
Platelets: 133 — ABNORMAL LOW
Platelets: 210
Platelets: 252
Platelets: 267
RBC: 3.43 — ABNORMAL LOW
RBC: 3.45 — ABNORMAL LOW
RBC: 3.55 — ABNORMAL LOW
RBC: 4.45
RDW: 12.5
RDW: 12.5
RDW: 12.5
RDW: 12.8
RDW: 13.2
WBC: 11.1 — ABNORMAL HIGH
WBC: 9.9

## 2011-01-28 LAB — POCT I-STAT 3, ART BLOOD GAS (G3+)
Acid-base deficit: 3 — ABNORMAL HIGH
Acid-base deficit: 5 — ABNORMAL HIGH
Bicarbonate: 19.6 — ABNORMAL LOW
Bicarbonate: 20.7
Bicarbonate: 23.8
Operator id: 209041
Operator id: 3342
Operator id: 3342
Patient temperature: 35.3
Patient temperature: 37
TCO2: 21
TCO2: 21
TCO2: 23
TCO2: 25
pCO2 arterial: 40.4
pCO2 arterial: 41.8
pH, Arterial: 7.33 — ABNORMAL LOW
pH, Arterial: 7.364
pH, Arterial: 7.589 — ABNORMAL HIGH
pO2, Arterial: 268 — ABNORMAL HIGH
pO2, Arterial: 327 — ABNORMAL HIGH
pO2, Arterial: 77 — ABNORMAL LOW

## 2011-01-28 LAB — PROTIME-INR: Prothrombin Time: 17.3 — ABNORMAL HIGH

## 2011-01-28 LAB — POCT I-STAT 4, (NA,K, GLUC, HGB,HCT)
Glucose, Bld: 119 — ABNORMAL HIGH
Glucose, Bld: 122 — ABNORMAL HIGH
Glucose, Bld: 127 — ABNORMAL HIGH
Glucose, Bld: 130 — ABNORMAL HIGH
HCT: 23 — ABNORMAL LOW
HCT: 25 — ABNORMAL LOW
HCT: 28 — ABNORMAL LOW
HCT: 33 — ABNORMAL LOW
Hemoglobin: 11.2 — ABNORMAL LOW
Hemoglobin: 8.2 — ABNORMAL LOW
Operator id: 3342
Operator id: 3342
Operator id: 3342
Potassium: 4.3
Potassium: 4.8
Potassium: 5.2 — ABNORMAL HIGH
Potassium: 6.6
Sodium: 133 — ABNORMAL LOW

## 2011-01-28 LAB — PLATELET COUNT: Platelets: 203

## 2011-01-28 LAB — POCT I-STAT 3, VENOUS BLOOD GAS (G3P V)
Acid-base deficit: 5 — ABNORMAL HIGH
Bicarbonate: 21.4
Operator id: 3342
TCO2: 23
pO2, Ven: 50 — ABNORMAL HIGH

## 2011-01-28 LAB — TYPE AND SCREEN
ABO/RH(D): A POS
Antibody Screen: NEGATIVE

## 2011-01-28 LAB — POCT I-STAT GLUCOSE: Operator id: 3342

## 2011-01-28 LAB — CREATININE, SERUM: GFR calc Af Amer: >60

## 2011-01-28 LAB — APTT: aPTT: 41 — ABNORMAL HIGH

## 2011-01-28 LAB — ABO/RH: ABO/RH(D): A POS

## 2011-01-28 LAB — CK TOTAL AND CKMB (NOT AT ARMC): CK, MB: 44.2 — ABNORMAL HIGH

## 2011-09-16 ENCOUNTER — Encounter: Payer: Self-pay | Admitting: Cardiovascular Disease

## 2011-09-17 ENCOUNTER — Encounter: Payer: Self-pay | Admitting: Cardiovascular Disease

## 2011-09-17 ENCOUNTER — Ambulatory Visit (INDEPENDENT_AMBULATORY_CARE_PROVIDER_SITE_OTHER): Payer: Medicare Other | Admitting: Cardiovascular Disease

## 2011-09-17 VITALS — BP 172/112 | HR 79 | Ht 72.0 in | Wt 221.0 lb

## 2011-09-17 DIAGNOSIS — R0602 Shortness of breath: Secondary | ICD-10-CM | POA: Insufficient documentation

## 2011-09-17 DIAGNOSIS — I208 Other forms of angina pectoris: Secondary | ICD-10-CM | POA: Insufficient documentation

## 2011-09-17 DIAGNOSIS — E785 Hyperlipidemia, unspecified: Secondary | ICD-10-CM | POA: Insufficient documentation

## 2011-09-17 DIAGNOSIS — Z951 Presence of aortocoronary bypass graft: Secondary | ICD-10-CM | POA: Insufficient documentation

## 2011-09-17 DIAGNOSIS — R079 Chest pain, unspecified: Secondary | ICD-10-CM

## 2011-09-17 DIAGNOSIS — I2089 Other forms of angina pectoris: Secondary | ICD-10-CM | POA: Insufficient documentation

## 2011-09-17 DIAGNOSIS — I10 Essential (primary) hypertension: Secondary | ICD-10-CM

## 2011-09-17 NOTE — Assessment & Plan Note (Signed)
We have suggested he continue on his statin. He is followed at the [facility]. Goal LDL less than 70.

## 2011-09-17 NOTE — Patient Instructions (Signed)
Please start amlodipine one a day for blood pressure  We will schedule you for a stress test (chemical) at [facility]: Hold metoprolol and amlodipine the day of the stress test No food that morning. No caffeine for 24 hrs prior to the test  Please call us if you have new issues that need to be addressed before your next appt.  Your physician wants you to follow-up in: 3 months.  You will receive a reminder letter in the mail two months in advance. If you don't receive a letter, please call our office to schedule the follow-up appointment.

## 2011-09-17 NOTE — Assessment & Plan Note (Signed)
Blood pressure is very elevated on today's visit. We will start amlodipine 10 mg daily. He will take a prescription to the [facility].

## 2011-09-17 NOTE — Assessment & Plan Note (Signed)
Details as above. Surgery in 2009. Recent symptoms is concerning for angina. Stress test as above.

## 2011-09-17 NOTE — Progress Notes (Signed)
Patient ID: Alain Deschene, male    DOB: 09/10/1960, 51 y.o.   MRN: [id number]  HPI Comments: Mr. Doshi is a 51 year old gentleman with history of nephrectomy in 1991,  coronary artery disease, MI x3, numerous stents placed at [facility] in the late 1990s, four-vessel bypass surgery in March 2009 with continued episodes of chest pain since that time, who has been out on disability secondary to continued chest pain, fatigue, inability to exert himself who presents to reestablish care in the [facility] office in [place].  He reports that he has been having a very difficult year secondary to the way he feels, particularly in the past 30 days. He reports having poor sleep, shortness of breath, chest tightness. He did report going back to work for a short period of time to return money to buy new teeth but had a very difficult time and is now not working secondary to the way he feels. 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 He denies any significant large common edema. He does have chronic fatigue and is unable to do what he would like to do. 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  Previous operative report he tells that he had a subtotally occluded LAD, previous stent to OM1 which was totally occluded with left to left collaterals, significant proximal circumflex disease, nondominant RCA.   He had a LIMA to the LAD, vein graft to distal LAD, vein graft to OM, vein graft to PDA  EKG today shows normal sinus rhythm with rate 79 beats per minute with old anteroseptal infarct, nonspecific ST and amount he anterolateral leads, inferior leads   Outpatient Encounter Prescriptions as of 09/17/2011  Medication Sig Dispense Refill  . aspirin 325 MG EC tablet Take 325 mg by mouth daily.      . metoprolol tartrate (LOPRESSOR) 25 MG tablet Take 25 mg by mouth 2 (two) times  daily.      . simvastatin (ZOCOR) 40 MG tablet Take 40 mg by mouth every evening.      Marland Kitchen amLODipine (NORVASC) 10 MG tablet Take 1 tablet (10 mg total) by mouth daily.  30 tablet  11    Review of Systems  Constitutional: Positive for fatigue.  HENT: Negative.   Eyes: Negative.   Respiratory: Positive for shortness of breath.   Cardiovascular: Positive for chest pain.  Gastrointestinal: Negative.   Musculoskeletal: Negative.   Skin: Negative.   Neurological: Positive for weakness.  Hematological: Negative.   Psychiatric/Behavioral: Negative.   All other systems reviewed and are negative.    BP 172/112  Pulse 79  Ht 6' (1.829 m)  Wt 221 lb (100.245 kg)  BMI 29.97 kg/m2  Physical Exam  Nursing note and vitals reviewed. Constitutional: He is oriented to person, place, and time. He appears well-developed and well-nourished.  HENT:  Head: Normocephalic.  Nose: Nose normal.  Mouth/Throat: Oropharynx is clear and moist.  Eyes: Conjunctivae are normal. Pupils are equal, round, and reactive to light.  Neck: Normal range of motion. Neck supple. No JVD present.  Cardiovascular: Normal rate, regular rhythm, S1 normal, S2 normal, normal heart sounds and intact distal pulses.  Exam reveals no gallop and no friction rub.   No murmur heard. Pulmonary/Chest: Effort normal and breath sounds normal. No respiratory distress. He has no wheezes.  He has no rales. He exhibits no tenderness.  Abdominal: Soft. Bowel sounds are normal. He exhibits no distension. There is no tenderness.  Musculoskeletal: Normal range of motion. He exhibits no edema and no tenderness.  Lymphadenopathy:    He has no cervical adenopathy.  Neurological: He is alert and oriented to person, place, and time. Coordination normal.  Skin: Skin is warm and dry. No rash noted. No erythema.  Psychiatric: He has a normal mood and affect. His behavior is normal. Judgment and thought content normal.           Assessment and  Plan

## 2011-09-17 NOTE — Assessment & Plan Note (Signed)
Recent worsening of his symptoms in the past 30 days. He does have underlying social stress as well. Given his history of severe disease and disabling cardiac issues, no imaging in 4 years, pharmacologic stress test has been ordered at [facility]. He is unable to exercise secondary to shortness of breath, back discomfort, deconditioned state.

## 2011-09-19 ENCOUNTER — Other Ambulatory Visit: Payer: Self-pay

## 2011-09-23 ENCOUNTER — Encounter: Payer: Self-pay | Admitting: Cardiovascular Disease

## 2011-10-07 ENCOUNTER — Telehealth: Payer: Self-pay

## 2011-10-07 NOTE — Telephone Encounter (Signed)
Pt called to say he forgot about stress test this am at [facility].  Wants to r/s. I told him I will check with Dr. Mariah Milling for best day and call him back.  Understanding verb.

## 2011-10-07 NOTE — Telephone Encounter (Signed)
R/s pt Tommy Kelly for 10/18/11. Verbal instructions given to pt. Also mailing instructions to pt.  Updated address in computer since it had changed.

## 2011-10-18 ENCOUNTER — Other Ambulatory Visit: Payer: Self-pay

## 2011-10-18 ENCOUNTER — Ambulatory Visit: Payer: Self-pay | Admitting: Cardiovascular Disease

## 2011-10-18 ENCOUNTER — Telehealth: Payer: Self-pay

## 2011-10-18 DIAGNOSIS — R079 Chest pain, unspecified: Secondary | ICD-10-CM

## 2011-10-18 DIAGNOSIS — Z01818 Encounter for other preprocedural examination: Secondary | ICD-10-CM

## 2011-10-18 DIAGNOSIS — I2510 Atherosclerotic heart disease of native coronary artery without angina pectoris: Secondary | ICD-10-CM

## 2011-10-18 NOTE — Telephone Encounter (Signed)
Pt was scheduled for lexiscan myoview this am at [facility]. Dr. 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  Pt walked in to office asking what he needs to do now.  He asks if there is another type of test he can have instead.  He had first MI in 1990 with multiple stents, then CABG in 2009.  He describes symptoms of chest pressure followed by palpitations and diaphoresis at night.  He says the type of pain he is experiencing is a "different pain" then what he had prior to previous MIs.  Pt is concerned and states "something's not right".  I had him wait in a room while I paged Dr. Mariah Milling to see what he wants [place] to do.

## 2011-10-18 NOTE — Telephone Encounter (Signed)
Discussed with Dr. Mariah Milling who would like to go ahead and schedule pt for [facility].  I will call pt to inform.

## 2011-10-18 NOTE — Telephone Encounter (Signed)
I called pt to inform him of Dr. Windell Hummingbird plan.  He is in agreeance and wishes to procedd with left heart cath scheduled for 10/25/11 at [facility].    He will come in to see Dr. Mariah Milling Monday 6/17 and have labs/CXR done same day.

## 2011-10-18 NOTE — Telephone Encounter (Signed)
Pt left office and asks that we call him with Dr. Windell Hummingbird decision.

## 2011-10-21 ENCOUNTER — Other Ambulatory Visit (INDEPENDENT_AMBULATORY_CARE_PROVIDER_SITE_OTHER): Payer: Medicare Other

## 2011-10-21 ENCOUNTER — Encounter: Payer: Self-pay | Admitting: Cardiovascular Disease

## 2011-10-21 ENCOUNTER — Ambulatory Visit (INDEPENDENT_AMBULATORY_CARE_PROVIDER_SITE_OTHER): Payer: Medicare Other | Admitting: Cardiovascular Disease

## 2011-10-21 ENCOUNTER — Telehealth: Payer: Self-pay

## 2011-10-21 VITALS — BP 140/92 | HR 64 | Ht 72.0 in | Wt 224.5 lb

## 2011-10-21 DIAGNOSIS — I209 Angina pectoris, unspecified: Secondary | ICD-10-CM

## 2011-10-21 DIAGNOSIS — E785 Hyperlipidemia, unspecified: Secondary | ICD-10-CM

## 2011-10-21 DIAGNOSIS — Z951 Presence of aortocoronary bypass graft: Secondary | ICD-10-CM

## 2011-10-21 DIAGNOSIS — R079 Chest pain, unspecified: Secondary | ICD-10-CM

## 2011-10-21 DIAGNOSIS — I208 Other forms of angina pectoris: Secondary | ICD-10-CM

## 2011-10-21 DIAGNOSIS — I2510 Atherosclerotic heart disease of native coronary artery without angina pectoris: Secondary | ICD-10-CM

## 2011-10-21 DIAGNOSIS — Z01818 Encounter for other preprocedural examination: Secondary | ICD-10-CM

## 2011-10-21 DIAGNOSIS — I10 Essential (primary) hypertension: Secondary | ICD-10-CM

## 2011-10-21 NOTE — Telephone Encounter (Signed)
cx'd [facility] scheduled for 6/21, per Dr. Windell Hummingbird request. LM on scheduling VM

## 2011-10-21 NOTE — Assessment & Plan Note (Signed)
Blood pressure is elevated. We will start temporary medications [facility]. We will start bystolic 10 mg.

## 2011-10-21 NOTE — Patient Instructions (Addendum)
You are doing well. No medication changes were made. We have given you a month of crestor and bystolic to cover you until the [facility] has a chance to set up your medications   Please call the office if you continue to have episodes of chest pain on your medications If you have side effects from the medications, call the office  Please call us if you have new issues that need to be addressed before your next appt.  Your physician wants you to follow-up in: 3 months.  You will receive a reminder letter in the mail two months in advance. If you don't receive a letter, please call our office to schedule the follow-up appointment.

## 2011-10-21 NOTE — Progress Notes (Signed)
Patient ID: Tommy Kelly, male    DOB: [birth year], 51 y.o.   MRN: [id number]  HPI Comments: Tommy Kelly is a 51 year old gentleman with history of nephrectomy in 1991,  coronary artery disease, MI x3, numerous stents placed at [facility] in the late 1990s, four-vessel bypass surgery in March 2009 with continued episodes of chest pain since that time, who has been out on disability secondary to continued chest pain, fatigue, inability to exert himself.  He reports that he is back on disability. He does not have any money for his medications though.  It is uncertain how long he has been without his metoprolol, Plavix, statin, amlodipine. He has been having some tachycardia at nighttime associated with some chest tightness. This happened twice last week. He feels well during the daytime with no chest pain with exertion. Symptoms have been going on for several weeks. He no longer works. He reported that he had to work previously to have his teeth repaired. He was working with heavy equipment.  He was unable to do a Myoview that was set up at [facility] as he was claustrophobic.  Previous operative report he tells that he had a subtotally occluded LAD, previous stent to OM1 which was totally occluded with left to left collaterals, significant proximal circumflex disease, nondominant RCA.   He had a LIMA to the LAD, vein graft to distal LAD, vein graft to OM, vein graft to PDA  PREVIOUS  EKG  showed normal sinus rhythm with rate 79 beats per minute with old anteroseptal infarct, nonspecific ST and amount he anterolateral leads, inferior leads   he is only taking aspirin, none of the other medications as he cannot afford it. Outpatient Encounter Prescriptions as of 10/21/2011  Medication Sig Dispense Refill  . amLODipine (NORVASC) 10 MG tablet Take 1 tablet (10 mg total) by mouth daily.  30 tablet  11  . aspirin 81 MG tablet Take 81 mg by mouth daily.      . clopidogrel (PLAVIX) 75 MG tablet Take 75 mg by mouth  daily.      . metoprolol tartrate (LOPRESSOR) 25 MG tablet Take 25 mg by mouth 2 (two) times daily.      . nitroGLYCERIN (NITROSTAT) 0.4 MG SL tablet Place 0.4 mg under the tongue every 5 (five) minutes as needed.      . simvastatin (ZOCOR) 40 MG tablet Take 40 mg by mouth every evening.        Review of Systems  HENT: Negative.   Eyes: Negative.   Cardiovascular: Positive for chest pain and palpitations.  Gastrointestinal: Negative.   Musculoskeletal: Negative.   Skin: Negative.   Hematological: Negative.   Psychiatric/Behavioral: Negative.   All other systems reviewed and are negative.    BP 140/92  Pulse 64  Ht 6' (1.829 m)  Wt 224 lb 8 oz (101.833 kg)  BMI 30.45 kg/m2  Physical Exam  Nursing note and vitals reviewed. Constitutional: He is oriented to person, place, and time. He appears well-developed and well-nourished.  HENT:  Head: Normocephalic.  Nose: Nose normal.  Mouth/Throat: Oropharynx is clear and moist.  Eyes: Conjunctivae are normal. Pupils are equal, round, and reactive to light.  Neck: Normal range of motion. Neck supple. No JVD present.  Cardiovascular: Normal rate, regular rhythm, S1 normal, S2 normal, normal heart sounds and intact distal pulses.  Exam reveals no gallop and no friction rub.   No murmur heard. Pulmonary/Chest: Effort normal and breath sounds normal. No respiratory distress. He  has no wheezes. He has no rales. He exhibits no tenderness.  Abdominal: Soft. Bowel sounds are normal. He exhibits no distension. There is no tenderness.  Musculoskeletal: Normal range of motion. He exhibits no edema and no tenderness.  Lymphadenopathy:    He has no cervical adenopathy.  Neurological: He is alert and oriented to person, place, and time. Coordination normal.  Skin: Skin is warm and dry. No rash noted. No erythema.  Psychiatric: He has a normal mood and affect. His behavior is normal. Judgment and thought content normal.           Assessment and  Plan

## 2011-10-21 NOTE — Assessment & Plan Note (Signed)
Uncertain if his symptoms are coronary were secondary to arrhythmia, possibly sleep apnea. He is unable to forward sleep apnea test. We will restart medications and if symptoms do not improve, we would proceed with cardiac catheterization.

## 2011-10-21 NOTE — Assessment & Plan Note (Signed)
Uncertain how long he has been off simvastatin. [facility]. We have given him samples of Crestor 20 mg daily until he is set up.

## 2011-10-21 NOTE — Assessment & Plan Note (Signed)
Recent atypical type chest pain. Symptoms concerning for tachycardia at nighttime. He is not taking his beta blocker. We will [facility] with his [facility]. We will give him samples of bystolic for heart rate control as well as blood pressure.

## 2011-11-04 ENCOUNTER — Other Ambulatory Visit: Payer: Self-pay | Admitting: Cardiovascular Disease

## 2011-11-04 DIAGNOSIS — R0602 Shortness of breath: Secondary | ICD-10-CM

## 2011-12-18 ENCOUNTER — Ambulatory Visit: Payer: Medicare Other | Admitting: Cardiovascular Disease

## 2012-01-24 ENCOUNTER — Ambulatory Visit: Payer: Medicare Other | Admitting: Cardiovascular Disease

## 2012-01-27 ENCOUNTER — Ambulatory Visit: Payer: Medicare Other | Admitting: Cardiovascular Disease

## 2012-01-31 ENCOUNTER — Ambulatory Visit (INDEPENDENT_AMBULATORY_CARE_PROVIDER_SITE_OTHER): Payer: Medicare Other | Admitting: Cardiovascular Disease

## 2012-01-31 ENCOUNTER — Encounter: Payer: Self-pay | Admitting: Cardiovascular Disease

## 2012-01-31 VITALS — BP 192/100 | HR 76 | Ht 72.0 in | Wt 225.5 lb

## 2012-01-31 DIAGNOSIS — E785 Hyperlipidemia, unspecified: Secondary | ICD-10-CM

## 2012-01-31 DIAGNOSIS — I2510 Atherosclerotic heart disease of native coronary artery without angina pectoris: Secondary | ICD-10-CM

## 2012-01-31 DIAGNOSIS — Z951 Presence of aortocoronary bypass graft: Secondary | ICD-10-CM

## 2012-01-31 DIAGNOSIS — Z9119 Patient's noncompliance with other medical treatment and regimen: Secondary | ICD-10-CM

## 2012-01-31 DIAGNOSIS — I10 Essential (primary) hypertension: Secondary | ICD-10-CM

## 2012-01-31 DIAGNOSIS — Z9114 Patient's other noncompliance with medication regimen: Secondary | ICD-10-CM | POA: Insufficient documentation

## 2012-01-31 MED ORDER — SIMVASTATIN 40 MG PO TABS: 40.0000 mg | ORAL_TABLET | Freq: Every evening | ORAL | Status: DC

## 2012-01-31 MED ORDER — METOPROLOL TARTRATE 25 MG PO TABS: 25.0000 mg | ORAL_TABLET | Freq: Two times a day (BID) | ORAL | Status: DC

## 2012-01-31 MED ORDER — AMLODIPINE BESYLATE 10 MG PO TABS: 10.0000 mg | ORAL_TABLET | Freq: Every day | ORAL | Status: DC

## 2012-01-31 MED ORDER — AZITHROMYCIN 250 MG PO TABS: ORAL_TABLET | ORAL | Status: DC

## 2012-01-31 MED ORDER — CLOPIDOGREL BISULFATE 75 MG PO TABS: 75.0000 mg | ORAL_TABLET | Freq: Every day | ORAL | Status: DC

## 2012-01-31 NOTE — Assessment & Plan Note (Signed)
Currently with no symptoms of angina. No further workup at this time. Continue current medication regimen. 

## 2012-01-31 NOTE — Patient Instructions (Addendum)
Please restart your medications.  Goal blood pressure is less than 140 on the top, less than 90 on the bottom number  Please start the Z-pak for 5 days  Please call us if you have new issues that need to be addressed before your next appt.  Your physician wants you to follow-up in: 6 months.  You will receive a reminder letter in the mail two months in advance. If you don't receive a letter, please call our office to schedule the follow-up appointment.

## 2012-01-31 NOTE — Assessment & Plan Note (Signed)
We have refilled all of his medications again. We have encouraged him to take his medications and call us if he is unable to fill them.

## 2012-01-31 NOTE — Progress Notes (Signed)
   Patient ID: Tommy Kelly, male    DOB: June 25, 1960, 51 y.o.   MRN: [id number]  HPI Comments: Tommy Kelly is a 51 year old gentleman with history of nephrectomy in 1991,  coronary artery disease, MI x3, numerous stents placed at [facility] in the late 1990s, four-vessel bypass surgery in March 2009, chronic chest pain for a long period of time after his surgery, on disability. He then lost his disability, could not afford his medications. Currently he is working again and has insurance. He would like refills on his prescriptions.  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 His knees have started to bother him. He does not report significant chest pain or shortness of breath with exertion activity. He has not had his medications for several months but has been taking aspirin  Previously he was unable to do a Myoview that was set up at [facility] as he was claustrophobic.  Previous operative report he tells that he had a subtotally occluded LAD, previous stent to OM1 which was totally occluded with left to left collaterals, significant proximal circumflex disease, nondominant RCA.   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   Review of Systems  Constitutional: Negative.   HENT: Negative.   Eyes: Negative.   Respiratory: Negative.   Gastrointestinal: Negative.   Musculoskeletal: Positive for joint swelling and arthralgias.  Skin: Negative.   Neurological: Negative.   Hematological: Negative.   Psychiatric/Behavioral: Negative.   All other systems reviewed and are negative.    BP 192/100  Pulse 76  Ht 6' (1.829 m)  Wt 225 lb 8 oz (102.286 kg)  BMI 30.58 kg/m2 Improve to  170/100 on repeat Physical Exam  Nursing note and vitals reviewed. Constitutional: He is oriented to person, place, and time. He appears well-developed and well-nourished.  HENT:  Head: Normocephalic.  Nose: Nose normal.  Mouth/Throat: Oropharynx is clear and moist.  Eyes: Conjunctivae normal are normal. Pupils are equal, round, and reactive to light.  Neck: Normal range of motion. Neck supple. No JVD present.  Cardiovascular: Normal rate, regular rhythm, S1 normal, S2 normal, normal heart sounds and intact distal pulses.  Exam reveals no gallop and no friction rub.   No murmur heard. Pulmonary/Chest: Effort normal and breath sounds normal. No respiratory distress. He has no wheezes. He has no rales. He exhibits no tenderness.  Abdominal: Soft. Bowel sounds are normal. He exhibits no distension. There is no tenderness.  Musculoskeletal: Normal range of motion. He exhibits no edema and no tenderness.  Lymphadenopathy:    He has no cervical adenopathy.  Neurological: He is alert and oriented to person, place, and time. Coordination normal.  Skin: Skin is warm and dry. No rash noted. No erythema.  Psychiatric: He has a normal mood and affect. His behavior is normal. Judgment and thought content normal.           Assessment and Plan

## 2012-01-31 NOTE — Assessment & Plan Note (Signed)
Blood pressure is poorly controlled today. We will restart his amlodipine and metoprolol as he was taking previously. We have suggested he closely monitor his blood pressure.

## 2012-01-31 NOTE — Assessment & Plan Note (Signed)
We have suggested he restart his statin. Goal LDL less than 70.

## 2012-03-23 ENCOUNTER — Telehealth: Payer: Self-pay | Admitting: Cardiovascular Disease

## 2012-03-23 ENCOUNTER — Other Ambulatory Visit: Payer: Self-pay

## 2012-03-23 DIAGNOSIS — I10 Essential (primary) hypertension: Secondary | ICD-10-CM

## 2012-03-23 MED ORDER — LISINOPRIL-HYDROCHLOROTHIAZIDE 20-12.5 MG PO TABS: 1.0000 | ORAL_TABLET | Freq: Every day | ORAL | Status: DC

## 2012-03-23 NOTE — Telephone Encounter (Signed)
Pt calling stating that he is having swelling in his feet and ankles.

## 2012-03-23 NOTE — Telephone Encounter (Signed)
Pt says he has been experiencing some LE edema in feet and ankles and states he has "never had this before" Has had a "cold" since June and has been on Z pack but is still SOB from this "cold" Has an appt with Dr. Dan Humphreys as a new pt to her in January  Confirms compliance with meds (see med list) I told him I would discuss with Dr. Mariah Milling and call him back Understanding verb Says BP is "better" (lower)

## 2012-03-23 NOTE — Telephone Encounter (Signed)
Discussed with Dr. Mariah Milling who gave orders: "Stop amlodipine. Start lisinopril/HCT 20/12.5 mg daily. Return to office in 2-3 weeks for BP check and BMP". VO Dr. Alvis Lemmings, RN  Pt informed Understanding verb RX sent to pharm Will call us back to schedule appt Says BP=105/80

## 2012-03-27 ENCOUNTER — Telehealth: Payer: Self-pay

## 2012-03-27 ENCOUNTER — Other Ambulatory Visit: Payer: Self-pay

## 2012-03-27 NOTE — Telephone Encounter (Signed)
I called pt to assess symptoms (see previous telephone note) He says edema has improved with med change but still feels as if he has a "cold" He confirms compliance with new BP med but did not give me new BP reading I scheduled him for appt for BMP and BP check at his convenience This was scheduled for 12/2 at 4:20

## 2012-04-06 ENCOUNTER — Ambulatory Visit (INDEPENDENT_AMBULATORY_CARE_PROVIDER_SITE_OTHER): Payer: Medicare Other

## 2012-04-06 DIAGNOSIS — I10 Essential (primary) hypertension: Secondary | ICD-10-CM

## 2012-04-07 LAB — BASIC METABOLIC PANEL
BUN/Creatinine Ratio: 13 (ref 9–20)
BUN: 18 mg/dL (ref 6–24)
CO2: 23 mmol/L (ref 19–28)
Chloride: 99 mmol/L (ref 97–108)
Sodium: 141 mmol/L (ref 134–144)

## 2012-04-15 NOTE — Telephone Encounter (Signed)
Will you please review lab results and advise? Thanks!

## 2012-04-15 NOTE — Telephone Encounter (Signed)
Pt calling with questions about medications and also labs results.

## 2012-04-15 NOTE — Telephone Encounter (Signed)
Pt informed Says he has not checked BP since last telephone call He will try to increase fluid intake at our instruction He is still concerned about this "cold that wont go away" He is coughing up yellow sputum Afebrile Says he went to pharmacy and picked up some Coricidin which helped to break up cough but has now taken all this med and does not think he should continue Congestion is worse Would like appt with Dr. Nechama Guard to work him in this week with Dr.Arida He declines, says he would rather see Dr. Mariah Milling appt made for 12/24 at 1100 He agrees to call us back sooner should symptoms get worse/change

## 2012-04-15 NOTE — Telephone Encounter (Signed)
Lab work is adequate, renal function borderline dry and perhaps he could increase his fluid intake slightly Potassium borderline high. Would decrease potassium and citrus intake How is his blood pressure?

## 2012-04-28 ENCOUNTER — Ambulatory Visit (INDEPENDENT_AMBULATORY_CARE_PROVIDER_SITE_OTHER): Payer: Medicare Other | Admitting: Cardiovascular Disease

## 2012-04-28 ENCOUNTER — Encounter: Payer: Self-pay | Admitting: Cardiovascular Disease

## 2012-04-28 VITALS — BP 140/80 | HR 72 | Ht 72.0 in | Wt 222.0 lb

## 2012-04-28 DIAGNOSIS — E785 Hyperlipidemia, unspecified: Secondary | ICD-10-CM

## 2012-04-28 DIAGNOSIS — I10 Essential (primary) hypertension: Secondary | ICD-10-CM

## 2012-04-28 DIAGNOSIS — Z951 Presence of aortocoronary bypass graft: Secondary | ICD-10-CM

## 2012-04-28 DIAGNOSIS — J069 Acute upper respiratory infection, unspecified: Secondary | ICD-10-CM | POA: Insufficient documentation

## 2012-04-28 MED ORDER — LEVOFLOXACIN 750 MG PO TABS: 750.0000 mg | ORAL_TABLET | Freq: Every day | ORAL | Status: DC

## 2012-04-28 MED ORDER — AMOXICILLIN-POT CLAVULANATE 875-125 MG PO TABS: 1.0000 | ORAL_TABLET | Freq: Two times a day (BID) | ORAL | Status: DC

## 2012-04-28 NOTE — Assessment & Plan Note (Signed)
Currently with no symptoms of angina. No further workup at this time. Continue current medication regimen. 

## 2012-04-28 NOTE — Progress Notes (Signed)
Patient ID: Tommy Kelly, male    DOB: January 18, 1961, 51 y.o.   MRN: [id number]  HPI Comments: Mr. Consalvo is a 51 year old gentleman with history of nephrectomy in 1991,  coronary artery disease, MI x3, numerous stents placed at [facility] in the late 1990s, four-vessel bypass surgery in March 2009, chronic chest pain for a long period of time after his surgery, on disability. He then lost his disability, could not afford his medications. Currently he is working again and has insurance/medicare.   He states having significant cough, sputum, sinus congestion, chest congestion for the past month or more. He has tried a Z-Pak with prednisone with brief improvement of his symptoms but symptoms have since recurred and currently he feels very bad. He has difficulty lying flat with congestion and shortness of breath. Yellow and green sputum, malaise. He is requesting a second antibiotic.  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 His knees have started to bother him. He does not report significant chest pain or shortness of breath with exertion activity. He reports that he has been taking his medications.  Previously he was unable to do a Myoview that was set up at [facility] as he was claustrophobic.  Previous operative report he tells that he had a subtotally occluded LAD, previous stent to OM1 which was totally occluded with left to left collaterals, significant proximal circumflex disease, nondominant RCA.   He had a LIMA to the LAD, vein graft to distal LAD, vein graft to OM, vein graft to PDA   EKG  showed normal sinus rhythm with rate 72 beats per minute with old anteroseptal infarct, ST abnormality in the anterolateral leads, inferior leads    Review of Systems  Constitutional: Positive for fatigue.  HENT: Positive for congestion and rhinorrhea.   Eyes: Negative.   Respiratory: Positive for cough.   Gastrointestinal: Negative.   Musculoskeletal: Positive  for joint swelling and arthralgias.  Skin: Negative.   Neurological: Negative.   Hematological: Negative.   Psychiatric/Behavioral: Negative.   All other systems reviewed and are negative.    BP 140/80  Pulse 72  Ht 6' (1.829 m)  Wt 222 lb (100.699 kg)  BMI 30.11 kg/m2  Physical Exam  Nursing note and vitals reviewed. Constitutional: He is oriented to person, place, and time. He appears well-developed and well-nourished.  HENT:  Head: Normocephalic.  Nose: Nose normal.  Mouth/Throat: Oropharynx is clear and moist.  Eyes: Conjunctivae normal are normal. Pupils are equal, round, and reactive to light.  Neck: Normal range of motion. Neck supple. No JVD present.  Cardiovascular: Normal rate, regular rhythm, S1 normal, S2 normal, normal heart sounds and intact distal pulses.  Exam reveals no gallop and no friction rub.   No murmur heard. Pulmonary/Chest: Effort normal and breath sounds normal. No respiratory distress. He has no wheezes. He has no rales. He exhibits no tenderness.  Abdominal: Soft. Bowel sounds are normal. He exhibits no distension. There is no tenderness.  Musculoskeletal: Normal range of motion. He exhibits no edema and no tenderness.  Lymphadenopathy:    He has no cervical adenopathy.  Neurological: He is alert and oriented to person, place, and time. Coordination normal.  Skin: Skin is warm and dry. No rash noted. No erythema.  Psychiatric: He has a normal mood and affect. His behavior is normal. Judgment and thought content normal.           Assessment and Plan

## 2012-04-28 NOTE — Assessment & Plan Note (Signed)
He is requesting a second round of antibiotics given that his symptoms have persisted. Biggest complaint is thick mucus in his chest and shortness of breath. He is unable to afford most of the antibiotics as they are not generic. Levaquin is $150, Avelox is more expensive. We will send in a prescription for Augmentin as he is already tried Z-Pak. Other option would be Ceftin twice a day.

## 2012-04-28 NOTE — Patient Instructions (Addendum)
You are doing well. Please start levaquin one a day for 14 days Call the office if symptoms do not start to get better  Please call us if you have new issues that need to be addressed before your next appt.  Your physician wants you to follow-up in: 6 months.  You will receive a reminder letter in the mail two months in advance. If you don't receive a letter, please call our office to schedule the follow-up appointment.

## 2012-04-28 NOTE — Assessment & Plan Note (Signed)
We have suggested he continue on his statin

## 2012-04-28 NOTE — Assessment & Plan Note (Signed)
Blood pressure is well controlled on today's visit. No changes made to the medications. 

## 2012-05-15 ENCOUNTER — Ambulatory Visit: Payer: Medicare Other | Admitting: Internal Medicine

## 2012-06-02 ENCOUNTER — Emergency Department: Payer: Self-pay | Admitting: Emergency Medicine

## 2012-07-17 ENCOUNTER — Encounter: Payer: Self-pay | Admitting: Cardiovascular Disease

## 2012-07-17 ENCOUNTER — Ambulatory Visit (INDEPENDENT_AMBULATORY_CARE_PROVIDER_SITE_OTHER): Payer: Medicare Other | Admitting: Cardiovascular Disease

## 2012-07-17 VITALS — BP 160/94 | HR 86 | Ht 72.0 in | Wt 231.8 lb

## 2012-07-17 DIAGNOSIS — I10 Essential (primary) hypertension: Secondary | ICD-10-CM

## 2012-07-17 DIAGNOSIS — R079 Chest pain, unspecified: Secondary | ICD-10-CM | POA: Insufficient documentation

## 2012-07-17 DIAGNOSIS — R0609 Other forms of dyspnea: Secondary | ICD-10-CM

## 2012-07-17 DIAGNOSIS — Z951 Presence of aortocoronary bypass graft: Secondary | ICD-10-CM

## 2012-07-17 DIAGNOSIS — R0989 Other specified symptoms and signs involving the circulatory and respiratory systems: Secondary | ICD-10-CM

## 2012-07-17 DIAGNOSIS — R0600 Dyspnea, unspecified: Secondary | ICD-10-CM

## 2012-07-17 NOTE — Assessment & Plan Note (Signed)
His current chest pain is clearly musculoskeletal and very different from his previous angina. It seems to be related to the recent trauma. His EKG does not show any new changes. I recommend an echocardiogram to make sure he has no precordial effusion or evidence of myocardial contusion .

## 2012-07-17 NOTE — Assessment & Plan Note (Signed)
His blood pressure is elevated but he seems to be in pain.

## 2012-07-17 NOTE — Assessment & Plan Note (Signed)
No recurrent symptoms of angina. Continue medical therapy.

## 2012-07-17 NOTE — Patient Instructions (Addendum)
Your physician has requested that you have an echocardiogram. Echocardiography is a painless test that uses sound waves to create images of your heart. It provides your doctor with information about the size and shape of your heart and how well your heart's chambers and valves are working. This procedure takes approximately one hour. There are no restrictions for this procedure.  Follow up with Dr. Mariah Milling as scheduled before
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
## 2012-07-28 ENCOUNTER — Other Ambulatory Visit (INDEPENDENT_AMBULATORY_CARE_PROVIDER_SITE_OTHER): Payer: Medicare Other

## 2012-07-28 ENCOUNTER — Other Ambulatory Visit: Payer: Self-pay

## 2012-07-28 DIAGNOSIS — R0609 Other forms of dyspnea: Secondary | ICD-10-CM

## 2012-07-28 DIAGNOSIS — R079 Chest pain, unspecified: Secondary | ICD-10-CM

## 2012-07-28 DIAGNOSIS — R0600 Dyspnea, unspecified: Secondary | ICD-10-CM

## 2012-09-21 ENCOUNTER — Telehealth: Payer: Self-pay

## 2012-09-21 NOTE — Telephone Encounter (Signed)
Request from [facility] , sent to [employer] on 09/22/2012 .

## 2012-10-01 ENCOUNTER — Telehealth: Payer: Self-pay

## 2012-10-01 NOTE — Telephone Encounter (Signed)
Request from [facility] [employer] [facility] for [facility]  , sent to [employer] on 10/01/12 .

## 2012-10-14 ENCOUNTER — Telehealth: Payer: Self-pay

## 2012-10-14 NOTE — Telephone Encounter (Signed)
Request from [facility] [employer] [facility], sent to [employer] on 10/15/2012 .

## 2012-10-28 ENCOUNTER — Encounter: Payer: Self-pay | Admitting: Cardiovascular Disease

## 2012-10-28 ENCOUNTER — Ambulatory Visit (INDEPENDENT_AMBULATORY_CARE_PROVIDER_SITE_OTHER): Payer: Medicare Other | Admitting: Cardiovascular Disease

## 2012-10-28 VITALS — BP 168/102 | HR 68 | Ht 72.0 in | Wt 245.5 lb

## 2012-10-28 DIAGNOSIS — R600 Localized edema: Secondary | ICD-10-CM | POA: Insufficient documentation

## 2012-10-28 DIAGNOSIS — R55 Syncope and collapse: Secondary | ICD-10-CM

## 2012-10-28 DIAGNOSIS — R079 Chest pain, unspecified: Secondary | ICD-10-CM

## 2012-10-28 DIAGNOSIS — R609 Edema, unspecified: Secondary | ICD-10-CM

## 2012-10-28 DIAGNOSIS — I10 Essential (primary) hypertension: Secondary | ICD-10-CM

## 2012-10-28 DIAGNOSIS — E785 Hyperlipidemia, unspecified: Secondary | ICD-10-CM

## 2012-10-28 DIAGNOSIS — Z951 Presence of aortocoronary bypass graft: Secondary | ICD-10-CM

## 2012-10-28 MED ORDER — LISINOPRIL 20 MG PO TABS: 20.0000 mg | ORAL_TABLET | Freq: Every day | ORAL | Status: DC
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
## 2012-10-28 NOTE — Assessment & Plan Note (Signed)
Atypical chest pain relieved with TUMS. We have asked him to contact our office if he has further episodes

## 2012-10-28 NOTE — Assessment & Plan Note (Signed)
Leg edema could be secondary to amlodipine. We have suggested he cut the amlodipine in half. If symptoms persist, we could hold the amlodipine

## 2012-10-28 NOTE — Assessment & Plan Note (Signed)
Encouraged him to stay on his statin. We'll check his cholesterol in the next visit.

## 2012-10-28 NOTE — Assessment & Plan Note (Signed)
Blood pressures high. We will decrease the amlodipine to 5 mg daily for edema. We will start lisinopril 20 mg daily. Blood pressure continues to run high, the dose could be increased or other medications added.

## 2012-10-28 NOTE — Assessment & Plan Note (Signed)
Recent episode of syncope likely from heavy laughing. Encouraged him to stay hydrated. No further episodes since that time.

## 2012-10-28 NOTE — Patient Instructions (Addendum)
  Please cut the amlodipine in 1/2 daily Start lisinopril 20 mg a day Monitor your blood pressure at home. Call for any reactions, continued leg swelling  Please call us if you have new issues that need to be addressed before your next appt.  Your physician wants you to follow-up in: 6 months.  You will receive a reminder letter in the mail two months in advance. If you don't receive a letter, please call our office to schedule the follow-up appointment.

## 2013-02-01 ENCOUNTER — Telehealth: Payer: Self-pay | Admitting: *Deleted

## 2013-02-01 NOTE — Telephone Encounter (Signed)
Calling about his handicap sticker form that Dr. Mariah Milling needs to sign. Thanks

## 2013-02-01 NOTE — Telephone Encounter (Signed)
Spoke w/ pt.  Informed him that I will call him as soon as his sticker info has been completed and it is ready to be picked up.

## 2013-02-04 ENCOUNTER — Other Ambulatory Visit: Payer: Self-pay | Admitting: *Deleted

## 2013-02-04 ENCOUNTER — Other Ambulatory Visit: Payer: Self-pay | Admitting: Cardiovascular Disease

## 2013-02-04 MED ORDER — CLOPIDOGREL BISULFATE 75 MG PO TABS: 75.0000 mg | ORAL_TABLET | Freq: Every day | ORAL | Status: DC

## 2013-02-04 NOTE — Telephone Encounter (Signed)
Refilled Clopidogrel sent to [facility]. 

## 2013-02-08 ENCOUNTER — Other Ambulatory Visit: Payer: Self-pay | Admitting: Cardiovascular Disease

## 2013-02-08 ENCOUNTER — Other Ambulatory Visit: Payer: Self-pay | Admitting: *Deleted

## 2013-02-08 MED ORDER — CLOPIDOGREL BISULFATE 75 MG PO TABS: 75.0000 mg | ORAL_TABLET | Freq: Every day | ORAL | Status: DC

## 2013-02-08 NOTE — Telephone Encounter (Signed)
Refilled clopidogrel sent to [facility] pharmacy.

## 2013-03-05 ENCOUNTER — Telehealth: Payer: Self-pay

## 2013-03-05 ENCOUNTER — Other Ambulatory Visit: Payer: Self-pay | Admitting: *Deleted

## 2013-03-05 ENCOUNTER — Other Ambulatory Visit: Payer: Self-pay | Admitting: Cardiovascular Disease

## 2013-03-05 MED ORDER — SIMVASTATIN 40 MG PO TABS: 40.0000 mg | ORAL_TABLET | Freq: Every evening | ORAL | Status: DC

## 2013-03-05 NOTE — Telephone Encounter (Signed)
Pt called and states he went to the pharmacy to get his medication, and needs refills. States he is not sure what it is, " for my BP or something". Please call.

## 2013-03-05 NOTE — Telephone Encounter (Signed)
Requested Prescriptions   Signed Prescriptions Disp Refills  . simvastatin (ZOCOR) 40 MG tablet 30 tablet 3    Sig: Take 1 tablet (40 mg total) by mouth every evening.    Authorizing Provider: Antonieta Iba    Ordering User: Kendrick Fries

## 2013-03-05 NOTE — Telephone Encounter (Signed)
Spoke with pt and simvastatin has already been sent to pharmacy.
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
## 2013-03-27 ENCOUNTER — Other Ambulatory Visit: Payer: Self-pay | Admitting: Cardiovascular Disease

## 2013-03-29 ENCOUNTER — Other Ambulatory Visit: Payer: Self-pay | Admitting: *Deleted

## 2013-03-29 MED ORDER — AMLODIPINE BESYLATE 10 MG PO TABS: 10.0000 mg | ORAL_TABLET | Freq: Every day | ORAL | Status: DC

## 2013-03-29 NOTE — Telephone Encounter (Signed)
Requested Prescriptions   Signed Prescriptions Disp Refills  . amLODipine (NORVASC) 10 MG tablet 30 tablet 3    Sig: Take 1 tablet (10 mg total) by mouth daily.    Authorizing Provider: GOLLAN, TIMOTHY J    Ordering User: Rosalita Carey C    

## 2013-04-21 ENCOUNTER — Ambulatory Visit: Payer: Medicare Other | Admitting: Cardiovascular Disease

## 2013-05-12 ENCOUNTER — Ambulatory Visit: Payer: Medicare Other | Admitting: Cardiovascular Disease

## 2013-05-28 ENCOUNTER — Other Ambulatory Visit: Payer: Self-pay | Admitting: *Deleted

## 2013-05-28 ENCOUNTER — Other Ambulatory Visit: Payer: Self-pay | Admitting: Cardiovascular Disease

## 2013-05-28 MED ORDER — LISINOPRIL 20 MG PO TABS: 20.0000 mg | ORAL_TABLET | Freq: Every day | ORAL | Status: DC

## 2013-05-28 NOTE — Telephone Encounter (Signed)
Requested Prescriptions   Signed Prescriptions Disp Refills  . lisinopril (PRINIVIL,ZESTRIL) 20 MG tablet 30 tablet 6    Sig: Take 1 tablet (20 mg total) by mouth daily.    Authorizing Provider: Antonieta IbaGOLLAN, TIMOTHY J    Ordering User: Kendrick FriesLOPEZ, Angelyna Henderson C

## 2013-06-15 ENCOUNTER — Ambulatory Visit: Payer: Medicare Other | Admitting: Cardiovascular Disease

## 2013-06-25 ENCOUNTER — Ambulatory Visit: Payer: Medicare Other | Admitting: Cardiovascular Disease

## 2013-07-05 ENCOUNTER — Ambulatory Visit: Payer: Medicare Other | Admitting: Cardiovascular Disease

## 2013-07-13 ENCOUNTER — Ambulatory Visit: Payer: Medicare Other | Admitting: Cardiovascular Disease

## 2013-07-23 ENCOUNTER — Ambulatory Visit: Payer: Medicare Other | Admitting: Cardiovascular Disease

## 2013-08-05 ENCOUNTER — Other Ambulatory Visit: Payer: Self-pay | Admitting: Cardiovascular Disease

## 2013-08-05 ENCOUNTER — Ambulatory Visit: Payer: Medicare Other | Admitting: Cardiovascular Disease

## 2013-08-12 ENCOUNTER — Ambulatory Visit: Payer: Medicare Other | Admitting: Cardiovascular Disease

## 2013-08-23 ENCOUNTER — Encounter: Payer: Self-pay | Admitting: Cardiovascular Disease

## 2013-08-23 ENCOUNTER — Encounter (INDEPENDENT_AMBULATORY_CARE_PROVIDER_SITE_OTHER): Payer: Self-pay

## 2013-08-23 ENCOUNTER — Ambulatory Visit (INDEPENDENT_AMBULATORY_CARE_PROVIDER_SITE_OTHER): Payer: Medicare Other | Admitting: Cardiovascular Disease

## 2013-08-23 VITALS — BP 170/94 | HR 71 | Ht 72.0 in | Wt 231.2 lb

## 2013-08-23 DIAGNOSIS — R0602 Shortness of breath: Secondary | ICD-10-CM

## 2013-08-23 DIAGNOSIS — E785 Hyperlipidemia, unspecified: Secondary | ICD-10-CM

## 2013-08-23 DIAGNOSIS — J40 Bronchitis, not specified as acute or chronic: Secondary | ICD-10-CM

## 2013-08-23 DIAGNOSIS — Z951 Presence of aortocoronary bypass graft: Secondary | ICD-10-CM

## 2013-08-23 DIAGNOSIS — I10 Essential (primary) hypertension: Secondary | ICD-10-CM

## 2013-08-23 MED ORDER — LEVOFLOXACIN 500 MG PO TABS: 500.0000 mg | ORAL_TABLET | Freq: Every day | ORAL | Status: DC

## 2013-08-23 MED ORDER — TADALAFIL 5 MG PO TABS: 5.0000 mg | ORAL_TABLET | Freq: Every day | ORAL | Status: DC | PRN

## 2013-08-23 MED ORDER — LOSARTAN POTASSIUM 100 MG PO TABS: 100.0000 mg | ORAL_TABLET | Freq: Every day | ORAL | Status: DC

## 2013-08-23 NOTE — Assessment & Plan Note (Signed)
Currently with no symptoms of angina. No further workup at this time. Continue current medication regimen. 

## 2013-08-23 NOTE — Assessment & Plan Note (Signed)
He was started on Levaquin for at least 10 days. Symptoms concerning for bronchitis

## 2013-08-23 NOTE — Patient Instructions (Addendum)
Please start levaquin one a day for bronchitis,  Take for 10 to 14 days  Please start losartan one a day for high blood pressure Please monitor your blood pressure  Please call us if you have new issues that need to be addressed before your next appt.  Your physician wants you to follow-up in: 6 months.  You will receive a reminder letter in the mail two months in advance. If you don't receive a letter, please call our office to schedule the follow-up appointment.

## 2013-08-23 NOTE — Assessment & Plan Note (Signed)
Blood pressures running high today. We will start losartan 100 mg daily. Uncertain why he stopped his lisinopril. He feels it was secondary to side effects. Encouraged him to monitor his blood pressure at home, stay on his other medications
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
## 2013-08-23 NOTE — Assessment & Plan Note (Signed)
Encouraged him to stay on his simvastatin. Goal LDL less than 70 

## 2013-08-23 NOTE — Assessment & Plan Note (Signed)
Recent symptoms of shortness of breath and cough concerning for underlying infection. We'll treat with antibiotics

## 2013-11-03 NOTE — Telephone Encounter (Signed)
This encounter was created in error - please disregard.

## 2014-02-21 ENCOUNTER — Ambulatory Visit: Payer: Medicare Other | Admitting: Cardiovascular Disease

## 2014-03-03 ENCOUNTER — Ambulatory Visit: Payer: Medicare Other | Admitting: Cardiovascular Disease

## 2014-03-08 ENCOUNTER — Other Ambulatory Visit: Payer: Self-pay | Admitting: Cardiovascular Disease

## 2014-03-23 ENCOUNTER — Ambulatory Visit: Payer: Medicare Other | Admitting: Cardiovascular Disease

## 2014-04-03 ENCOUNTER — Other Ambulatory Visit: Payer: Self-pay | Admitting: Cardiovascular Disease

## 2014-04-11 ENCOUNTER — Ambulatory Visit: Payer: Medicare Other | Admitting: Cardiovascular Disease

## 2014-06-15 ENCOUNTER — Other Ambulatory Visit: Payer: Self-pay | Admitting: Cardiovascular Disease

## 2014-07-13 ENCOUNTER — Ambulatory Visit: Payer: Medicare Other | Admitting: Cardiovascular Disease

## 2014-10-06 ENCOUNTER — Ambulatory Visit: Payer: Medicare Other | Admitting: Cardiovascular Disease

## 2014-11-04 ENCOUNTER — Telehealth: Payer: Self-pay | Admitting: *Deleted

## 2014-11-04 ENCOUNTER — Other Ambulatory Visit: Payer: Self-pay

## 2014-11-04 MED ORDER — LOSARTAN POTASSIUM 100 MG PO TABS: 100.0000 mg | ORAL_TABLET | Freq: Every day | ORAL | Status: DC

## 2014-11-04 MED ORDER — SIMVASTATIN 40 MG PO TABS: ORAL_TABLET | ORAL | Status: DC

## 2014-11-04 MED ORDER — TADALAFIL 5 MG PO TABS: 5.0000 mg | ORAL_TABLET | Freq: Every day | ORAL | Status: DC | PRN

## 2014-11-04 NOTE — Telephone Encounter (Signed)
°  1. Which medications need to be refilled? Simvastatin, Celias   2. Which pharmacy is medication to be sent to? [facility] [address]   3. Do they need a 30 day or 90 day supply? 90   4. Would they like a call back once the medication has been sent to the pharmacy? No   Pt also states that [facility] will fax [facility] these, he just wanted to make sure we send them in, for he is out.

## 2014-11-04 NOTE — Telephone Encounter (Signed)
Refill sent for simvastatin and losartan 90 day supply.

## 2014-11-04 NOTE — Telephone Encounter (Signed)
Refill sent for losartan 100 mg  

## 2014-11-04 NOTE — Telephone Encounter (Signed)
Pt needs refill but did not know what kinds  Asked him to call back with the names of medications

## 2014-11-04 NOTE — Telephone Encounter (Signed)
Refill sent for cialis.

## 2014-11-15 ENCOUNTER — Ambulatory Visit: Payer: Medicare Other | Admitting: Cardiovascular Disease

## 2015-01-11 ENCOUNTER — Ambulatory Visit: Payer: Medicare Other | Admitting: Cardiovascular Disease

## 2015-02-27 ENCOUNTER — Ambulatory Visit: Payer: Medicare Other | Admitting: Cardiovascular Disease

## 2015-04-24 ENCOUNTER — Ambulatory Visit: Payer: Medicare Other | Admitting: Cardiovascular Disease

## 2015-06-14 ENCOUNTER — Ambulatory Visit: Payer: Medicare Other | Admitting: Cardiovascular Disease

## 2015-06-30 ENCOUNTER — Ambulatory Visit: Payer: Medicare Other | Admitting: Cardiovascular Disease

## 2015-08-08 ENCOUNTER — Ambulatory Visit: Payer: Medicare Other | Admitting: Cardiovascular Disease

## 2015-08-17 ENCOUNTER — Ambulatory Visit: Payer: Medicare Other | Admitting: Cardiovascular Disease

## 2015-09-15 ENCOUNTER — Ambulatory Visit: Payer: Medicare Other | Admitting: Cardiovascular Disease

## 2015-10-18 ENCOUNTER — Encounter: Payer: Self-pay | Admitting: Emergency Medicine

## 2015-10-18 ENCOUNTER — Observation Stay (HOSPITAL_BASED_OUTPATIENT_CLINIC_OR_DEPARTMENT_OTHER)
Admit: 2015-10-18 | Discharge: 2015-10-18 | Disposition: A | Discharge status: Home / Self Care | Payer: Medicaid Other | Attending: Internal Medicine | Admitting: Internal Medicine

## 2015-10-18 ENCOUNTER — Emergency Department: Payer: Medicaid Other

## 2015-10-18 ENCOUNTER — Observation Stay
Admission: EM | Admit: 2015-10-18 | Discharge: 2015-10-20 | Disposition: A | Discharge status: Home / Self Care | Payer: Medicaid Other | Attending: Internal Medicine | Admitting: Internal Medicine

## 2015-10-18 ENCOUNTER — Ambulatory Visit: Payer: Medicare Other | Admitting: Cardiovascular Disease

## 2015-10-18 DIAGNOSIS — Z888 Allergy status to other drugs, medicaments and biological substances status: Secondary | ICD-10-CM | POA: Insufficient documentation

## 2015-10-18 DIAGNOSIS — Z7982 Long term (current) use of aspirin: Secondary | ICD-10-CM | POA: Insufficient documentation

## 2015-10-18 DIAGNOSIS — Z91018 Allergy to other foods: Secondary | ICD-10-CM | POA: Insufficient documentation

## 2015-10-18 DIAGNOSIS — R079 Chest pain, unspecified: Secondary | ICD-10-CM

## 2015-10-18 DIAGNOSIS — I071 Rheumatic tricuspid insufficiency: Secondary | ICD-10-CM | POA: Diagnosis not present

## 2015-10-18 DIAGNOSIS — I25110 Atherosclerotic heart disease of native coronary artery with unstable angina pectoris: Secondary | ICD-10-CM | POA: Diagnosis not present

## 2015-10-18 DIAGNOSIS — I5042 Chronic combined systolic (congestive) and diastolic (congestive) heart failure: Secondary | ICD-10-CM | POA: Diagnosis not present

## 2015-10-18 DIAGNOSIS — Z91010 Allergy to peanuts: Secondary | ICD-10-CM | POA: Diagnosis not present

## 2015-10-18 DIAGNOSIS — Z91041 Radiographic dye allergy status: Secondary | ICD-10-CM | POA: Diagnosis not present

## 2015-10-18 DIAGNOSIS — E785 Hyperlipidemia, unspecified: Secondary | ICD-10-CM | POA: Diagnosis present

## 2015-10-18 DIAGNOSIS — Z91013 Allergy to seafood: Secondary | ICD-10-CM | POA: Insufficient documentation

## 2015-10-18 DIAGNOSIS — I25700 Atherosclerosis of coronary artery bypass graft(s), unspecified, with unstable angina pectoris: Secondary | ICD-10-CM | POA: Insufficient documentation

## 2015-10-18 DIAGNOSIS — F40240 Claustrophobia: Secondary | ICD-10-CM | POA: Insufficient documentation

## 2015-10-18 DIAGNOSIS — Z905 Acquired absence of kidney: Secondary | ICD-10-CM | POA: Insufficient documentation

## 2015-10-18 DIAGNOSIS — Z955 Presence of coronary angioplasty implant and graft: Secondary | ICD-10-CM | POA: Insufficient documentation

## 2015-10-18 DIAGNOSIS — I200 Unstable angina: Secondary | ICD-10-CM | POA: Diagnosis present

## 2015-10-18 DIAGNOSIS — I371 Nonrheumatic pulmonary valve insufficiency: Secondary | ICD-10-CM | POA: Diagnosis not present

## 2015-10-18 DIAGNOSIS — Z9114 Patient's other noncompliance with medication regimen: Secondary | ICD-10-CM | POA: Insufficient documentation

## 2015-10-18 DIAGNOSIS — Z951 Presence of aortocoronary bypass graft: Secondary | ICD-10-CM | POA: Diagnosis not present

## 2015-10-18 DIAGNOSIS — I130 Hypertensive heart and chronic kidney disease with heart failure and stage 1 through stage 4 chronic kidney disease, or unspecified chronic kidney disease: Secondary | ICD-10-CM | POA: Insufficient documentation

## 2015-10-18 DIAGNOSIS — I252 Old myocardial infarction: Secondary | ICD-10-CM | POA: Diagnosis not present

## 2015-10-18 DIAGNOSIS — I10 Essential (primary) hypertension: Secondary | ICD-10-CM

## 2015-10-18 DIAGNOSIS — N182 Chronic kidney disease, stage 2 (mild): Secondary | ICD-10-CM

## 2015-10-18 DIAGNOSIS — Z599 Problem related to housing and economic circumstances, unspecified: Secondary | ICD-10-CM | POA: Insufficient documentation

## 2015-10-18 HISTORY — DX: Chronic combined systolic (congestive) and diastolic (congestive) heart failure: I50.42

## 2015-10-18 LAB — LIPID PANEL
Cholesterol: 292 mg/dL — ABNORMAL HIGH (ref 0–200)
HDL: 52 mg/dL (ref >40)
LDL CALC: 214 mg/dL — AB (ref 0–99)
Total CHOL/HDL Ratio: 5.6 RATIO
Triglycerides: 131 mg/dL (ref <150)
VLDL: 26 mg/dL (ref 0–40)

## 2015-10-18 LAB — BASIC METABOLIC PANEL
Anion gap: 10 (ref 5–15)
BUN: 14 mg/dL (ref 6–20)
CO2: 24 mmol/L (ref 22–32)
Calcium: 9.3 mg/dL (ref 8.9–10.3)
Chloride: 102 mmol/L (ref 101–111)
Creatinine, Ser: 1.28 mg/dL — ABNORMAL HIGH (ref 0.61–1.24)
GFR calc Af Amer: >60 mL/min (ref >60)
GFR calc non Af Amer: >60 mL/min (ref >60)
Glucose, Bld: 119 mg/dL — ABNORMAL HIGH (ref 65–99)
Potassium: 4.4 mmol/L (ref 3.5–5.1)
Sodium: 136 mmol/L (ref 135–145)

## 2015-10-18 LAB — CBC
HCT: 47.5 % (ref 40.0–52.0)
Hemoglobin: 16.1 g/dL (ref 13.0–18.0)
MCH: 27.8 pg (ref 26.0–34.0)
MCHC: 33.9 g/dL (ref 32.0–36.0)
MCV: 82 fL (ref 80.0–100.0)
Platelets: 212 10*3/uL (ref 150–440)
RBC: 5.79 MIL/uL (ref 4.40–5.90)
RDW: 13.9 % (ref 11.5–14.5)
WBC: 5.8 10*3/uL (ref 3.8–10.6)

## 2015-10-18 LAB — TROPONIN I
TROPONIN I: 0.04 ng/mL — AB (ref <0.031)
TROPONIN I: 0.04 ng/mL — AB (ref <0.031)
Troponin I: 0.03 ng/mL (ref <0.031)
Troponin I: 0.04 ng/mL — ABNORMAL HIGH (ref <0.031)

## 2015-10-18 LAB — APTT: aPTT: 31 seconds (ref 24–36)

## 2015-10-18 LAB — HEPARIN LEVEL (UNFRACTIONATED): Heparin Unfractionated: 0.24 IU/mL — ABNORMAL LOW (ref 0.30–0.70)

## 2015-10-18 LAB — PROTIME-INR
INR: 1.09
Prothrombin Time: 14.3 seconds (ref 11.4–15.0)

## 2015-10-18 MED ORDER — ATORVASTATIN CALCIUM 20 MG PO TABS
80.0000 mg | ORAL_TABLET | Freq: Every day | ORAL | Status: DC
  Administered 2015-10-18 – 2015-10-19 (×2): 80 mg via ORAL
  Filled 2015-10-18: qty 4
  Filled 2015-10-18: qty 3
  Filled 2015-10-18: qty 4

## 2015-10-18 MED ORDER — CIPROFLOXACIN-DEXAMETHASONE 0.3-0.1 % OT SUSP
4.0000 [drp] | Freq: Two times a day (BID) | OTIC | Status: DC
  Administered 2015-10-18 – 2015-10-20 (×4): 4 [drp] via OTIC
  Filled 2015-10-18: qty 7.5

## 2015-10-18 MED ORDER — CLOPIDOGREL BISULFATE 75 MG PO TABS
75.0000 mg | ORAL_TABLET | Freq: Every day | ORAL | Status: DC
  Administered 2015-10-18 – 2015-10-19 (×2): 75 mg via ORAL
  Filled 2015-10-18 (×2): qty 1

## 2015-10-18 MED ORDER — SODIUM CHLORIDE 0.9 % IV SOLN
INTRAVENOUS | Status: AC
  Administered 2015-10-18: 16:00:00 via INTRAVENOUS

## 2015-10-18 MED ORDER — ASPIRIN 81 MG PO CHEW
81.0000 mg | CHEWABLE_TABLET | ORAL | Status: AC
  Administered 2015-10-19: 81 mg via ORAL
  Filled 2015-10-18: qty 1

## 2015-10-18 MED ORDER — SODIUM CHLORIDE 0.9% FLUSH
3.0000 mL | Freq: Two times a day (BID) | INTRAVENOUS | Status: DC
  Administered 2015-10-18 – 2015-10-19 (×2): 3 mL via INTRAVENOUS

## 2015-10-18 MED ORDER — ATORVASTATIN CALCIUM 20 MG PO TABS: 40.0000 mg | ORAL_TABLET | Freq: Every day | ORAL | Status: DC

## 2015-10-18 MED ORDER — HEPARIN SODIUM (PORCINE) 5000 UNIT/ML IJ SOLN
4000.0000 [IU] | Freq: Once | INTRAMUSCULAR | Status: AC
  Administered 2015-10-18: 4000 [IU] via INTRAVENOUS
  Filled 2015-10-18: qty 1

## 2015-10-18 MED ORDER — SODIUM CHLORIDE 0.9 % IV SOLN: 250.0000 mL | INTRAVENOUS | Status: DC | PRN

## 2015-10-18 MED ORDER — ONDANSETRON HCL 4 MG PO TABS: 4.0000 mg | ORAL_TABLET | Freq: Four times a day (QID) | ORAL | Status: DC | PRN

## 2015-10-18 MED ORDER — SODIUM CHLORIDE 0.9 % WEIGHT BASED INFUSION
3.0000 mL/kg/h | INTRAVENOUS | Status: DC
  Administered 2015-10-19: 3 mL/kg/h via INTRAVENOUS

## 2015-10-18 MED ORDER — HEPARIN BOLUS VIA INFUSION
1400.0000 [IU] | Freq: Once | INTRAVENOUS | Status: AC
  Administered 2015-10-18: 1400 [IU] via INTRAVENOUS
  Filled 2015-10-18: qty 1400

## 2015-10-18 MED ORDER — AMLODIPINE BESYLATE 5 MG PO TABS
10.0000 mg | ORAL_TABLET | Freq: Every day | ORAL | Status: DC
  Administered 2015-10-19: 10 mg via ORAL
  Filled 2015-10-18: qty 2

## 2015-10-18 MED ORDER — CIPROFLOXACIN-HYDROCORTISONE 0.2-1 % OT SUSP
3.0000 [drp] | Freq: Two times a day (BID) | OTIC | Status: DC
  Filled 2015-10-18: qty 10

## 2015-10-18 MED ORDER — MORPHINE SULFATE (PF) 2 MG/ML IV SOLN
2.0000 mg | INTRAVENOUS | Status: DC | PRN
  Administered 2015-10-18 – 2015-10-19 (×5): 2 mg via INTRAVENOUS
  Filled 2015-10-18 (×4): qty 1

## 2015-10-18 MED ORDER — ACETAMINOPHEN 650 MG RE SUPP: 650.0000 mg | Freq: Four times a day (QID) | RECTAL | Status: DC | PRN

## 2015-10-18 MED ORDER — ASPIRIN EC 81 MG PO TBEC
81.0000 mg | DELAYED_RELEASE_TABLET | Freq: Every day | ORAL | Status: DC
  Administered 2015-10-19 – 2015-10-20 (×2): 81 mg via ORAL
  Filled 2015-10-18 (×2): qty 1

## 2015-10-18 MED ORDER — ONDANSETRON HCL 4 MG/2ML IJ SOLN
4.0000 mg | Freq: Once | INTRAMUSCULAR | Status: AC
  Administered 2015-10-18: 4 mg via INTRAVENOUS
  Filled 2015-10-18: qty 2

## 2015-10-18 MED ORDER — ASPIRIN EC 81 MG PO TBEC
243.0000 mg | DELAYED_RELEASE_TABLET | Freq: Once | ORAL | Status: AC
  Administered 2015-10-18: 243 mg via ORAL
  Filled 2015-10-18: qty 3

## 2015-10-18 MED ORDER — SODIUM CHLORIDE 0.9% FLUSH: 3.0000 mL | INTRAVENOUS | Status: DC | PRN

## 2015-10-18 MED ORDER — HEPARIN (PORCINE) IN NACL 100-0.45 UNIT/ML-% IJ SOLN: 10.0000 [IU]/kg/h | Freq: Once | INTRAMUSCULAR | Status: DC

## 2015-10-18 MED ORDER — NITROGLYCERIN 0.4 MG SL SUBL
0.4000 mg | SUBLINGUAL_TABLET | SUBLINGUAL | Status: DC | PRN
  Administered 2015-10-18 (×2): 0.4 mg via SUBLINGUAL
  Filled 2015-10-18: qty 1

## 2015-10-18 MED ORDER — MORPHINE SULFATE (PF) 2 MG/ML IV SOLN
INTRAVENOUS | Status: AC
  Administered 2015-10-18: 2 mg via INTRAVENOUS
  Filled 2015-10-18: qty 1

## 2015-10-18 MED ORDER — HEPARIN (PORCINE) IN NACL 100-0.45 UNIT/ML-% IJ SOLN
1300.0000 [IU]/h | INTRAMUSCULAR | Status: DC
  Administered 2015-10-18: 1100 [IU]/h via INTRAVENOUS
  Administered 2015-10-19: 1300 [IU]/h via INTRAVENOUS
  Filled 2015-10-18 (×3): qty 250

## 2015-10-18 MED ORDER — NITROGLYCERIN IN D5W 200-5 MCG/ML-% IV SOLN: 0.0000 ug/min | INTRAVENOUS | Status: DC

## 2015-10-18 MED ORDER — SODIUM CHLORIDE 0.9% FLUSH
3.0000 mL | Freq: Two times a day (BID) | INTRAVENOUS | Status: DC
  Administered 2015-10-18: 3 mL via INTRAVENOUS

## 2015-10-18 MED ORDER — ONDANSETRON HCL 4 MG/2ML IJ SOLN
4.0000 mg | Freq: Four times a day (QID) | INTRAMUSCULAR | Status: DC | PRN
  Administered 2015-10-18: 4 mg via INTRAVENOUS
  Filled 2015-10-18: qty 2

## 2015-10-18 MED ORDER — LOSARTAN POTASSIUM 50 MG PO TABS
100.0000 mg | ORAL_TABLET | Freq: Every day | ORAL | Status: DC
Start: 2015-10-18 — End: 2015-10-20
  Administered 2015-10-19 – 2015-10-20 (×2): 100 mg via ORAL
  Filled 2015-10-18: qty 4
  Filled 2015-10-18: qty 2

## 2015-10-18 MED ORDER — SODIUM CHLORIDE 0.9 % WEIGHT BASED INFUSION
1.0000 mL/kg/h | INTRAVENOUS | Status: DC
  Administered 2015-10-19: 1 mL/kg/h via INTRAVENOUS

## 2015-10-18 MED ORDER — METOPROLOL TARTRATE 25 MG PO TABS
12.5000 mg | ORAL_TABLET | Freq: Two times a day (BID) | ORAL | Status: DC
  Administered 2015-10-19: 12.5 mg via ORAL
  Filled 2015-10-18 (×2): qty 1

## 2015-10-18 MED ORDER — ACETAMINOPHEN 325 MG PO TABS: 650.0000 mg | ORAL_TABLET | Freq: Four times a day (QID) | ORAL | Status: DC | PRN

## 2015-10-18 MED ORDER — NITROGLYCERIN IN D5W 200-5 MCG/ML-% IV SOLN
0.0000 ug/min | Freq: Once | INTRAVENOUS | Status: AC
  Administered 2015-10-18: 20 ug/min via INTRAVENOUS
  Filled 2015-10-18: qty 250

## 2015-10-18 NOTE — Progress Notes (Signed)
Around 2145, nurse was notified that patient had 5beats of VT. Patient is currently resting comfortable in bed, VS stabled. Dr. Lendell CapriceSullivan paged. New order: mag level.

## 2015-10-18 NOTE — ED Notes (Signed)
Says he had chest pain this amd and his heart was pounding.  Felt dizzy.  Now continues to have a sharper pain in left chest.  Has hx of mi, and has been feeling funny for last couple days.

## 2015-10-18 NOTE — H&P (Signed)
[facility] - [place] at [facility]   PATIENT NAME: Tommy Kelly    MR#:  [id number]  DATE OF BIRTH:  [birth year]  DATE OF ADMISSION:  10/18/2015  PRIMARY CARE PHYSICIAN: Julien Nordmann, MD   REQUESTING/REFERRING PHYSICIAN: Dr. Rockne Menghini  CHIEF COMPLAINT:   Chief Complaint  Patient presents with  . Chest Pain  . Tachycardia    HISTORY OF PRESENT ILLNESS:  Tommy Kelly  is a 55 y.o. male with a known history of CAD status post CABG and multiple stents several years ago, hypertension and hyperlipidemia presents to hospital secondary to chest pressure that started this morning. Patient has significant history of coronary artery disease in the family. Both parents died young from CAD, sister died in 97s from CAD mind patient had his bypass surgery and stents put in about 10 years ago. He has stopped taking his medications for the past year due to lack of disability. 2 days ago he noticed that he was having exertional chest pressure. Improved with rest. Also noticed dyspnea on minimal exertion. This morning the pain was much more intense and presented to the hospital. Pain was radiating to his left arm and left jaw. Also associated with nausea diaphoresis and shortness of breath. He still complains of a 6/10 chest pain at this time. He is placed on heparin drip and nitro drip. Due to his high risk cardiology was consulted and plans to possibly do a cardiac catheterization tomorrow. Patient does not do well with stress tests due to claustrophobia.  PAST MEDICAL HISTORY:   Past Medical History  Diagnosis Date  . Coronary artery disease     s/p CABG and multiple stents  . Hyperlipidemia   . Hypertension   . Chronic kidney disease     right kidney removed in 1992    PAST SURGICAL HISTORY:   Past Surgical History  Procedure Laterality Date  . Mi with stents    . 4 vessel bypass    . Vein bypass surgery    . Kidney surgery  1992    right kidney  removed  . Cardiac catheterization  2009    [facility]    SOCIAL HISTORY:   Social History  Substance Use Topics  . Smoking status: Never Smoker   . Smokeless tobacco: Not on file  . Alcohol Use: Yes     Comment: occassional    FAMILY HISTORY:   Family History  Problem Relation Age of Onset  . Heart attack Mother   . Heart attack Father   . Heart attack Sister     DRUG ALLERGIES:   Allergies  Allergen Reactions  . Cats Claw [Uncaria Tomentosa (Cats Claw)]   . Dye Fdc Red [Red Dye]   . Fish Allergy   . Peanut-Containing Drug Products     REVIEW OF SYSTEMS:   Review of Systems  Constitutional: Positive for diaphoresis. Negative for fever, chills, weight loss and malaise/fatigue.  HENT: Negative for ear discharge, ear pain, hearing loss and nosebleeds.   Eyes: Negative for blurred vision, double vision and photophobia.  Respiratory: Negative for cough, hemoptysis, shortness of breath and wheezing.   Cardiovascular: Positive for chest pain. Negative for palpitations, orthopnea and leg swelling.  Gastrointestinal: Positive for nausea. Negative for heartburn, vomiting, abdominal pain, diarrhea, constipation and melena.  Genitourinary: Negative for dysuria, urgency, frequency and hematuria.  Musculoskeletal: Negative for myalgias, back pain and neck pain.  Skin: Negative for rash.  Neurological: Negative for dizziness, tingling, tremors, sensory change,  speech change, focal weakness and headaches.  Endo/Heme/Allergies: Does not bruise/bleed easily.  Psychiatric/Behavioral: Negative for depression. The patient is nervous/anxious.     MEDICATIONS AT HOME:   Prior to Admission medications   Medication Sig Start Date End Date Taking? Authorizing Provider  aspirin 81 MG tablet Take 81 mg by mouth daily.    Yes Historical Provider, MD  amLODipine (NORVASC) 10 MG tablet Take 1 tablet (10 mg total) by mouth daily. 03/29/13   Antonieta Ibaimothy J Gollan, MD  clopidogrel (PLAVIX) 75 MG tablet  Take 1 tablet (75 mg total) by mouth daily. 02/08/13   Antonieta Ibaimothy J Gollan, MD  losartan (COZAAR) 100 MG tablet Take 1 tablet (100 mg total) by mouth daily. 11/04/14   Antonieta Ibaimothy J Gollan, MD  metoprolol tartrate (LOPRESSOR) 25 MG tablet Take 12.5 mg by mouth 2 (two) times daily. 01/31/12   Antonieta Ibaimothy J Gollan, MD  simvastatin (ZOCOR) 40 MG tablet TAKE 1 TABLET (40 MG TOTAL) BY MOUTH EVERY EVENING. 11/04/14   Antonieta Ibaimothy J Gollan, MD  tadalafil (CIALIS) 5 MG tablet Take 1 tablet (5 mg total) by mouth daily as needed for erectile dysfunction. 11/04/14   Antonieta Ibaimothy J Gollan, MD      VITAL SIGNS:  Blood pressure 194/87, pulse 89, temperature 97.7 F (36.5 C), temperature source Oral, resp. rate 15, height 6' (1.829 m), weight 95.255 kg (210 lb), SpO2 99 %.  PHYSICAL EXAMINATION:   Physical Exam  GENERAL:  55 y.o.-year-old patient lying in the bed with no acute distress.  EYES: Pupils equal, round, reactive to light and accommodation. No scleral icterus. Extraocular muscles intact.  HEENT: Head atraumatic, normocephalic. Oropharynx and nasopharynx clear.  NECK:  Supple, no jugular venous distention. No thyroid enlargement, no tenderness.  LUNGS: Normal breath sounds bilaterally, no wheezing, rales,rhonchi or crepitation. No use of accessory muscles of respiration.  CARDIOVASCULAR: S1, S2 normal. No murmurs, rubs, or gallops. Some musculoskeletal tenderness along left sternal border ABDOMEN: Soft, nontender, nondistended. Bowel sounds present. No organomegaly or mass.  EXTREMITIES: No pedal edema, cyanosis, or clubbing.  NEUROLOGIC: Cranial nerves II through XII are intact. Muscle strength 5/5 in all extremities. Sensation intact. Gait not checked.  PSYCHIATRIC: The patient is alert and oriented x 3.  SKIN: No obvious rash, lesion, or ulcer.   LABORATORY PANEL:   CBC  Recent Labs Lab 10/18/15 1000  WBC 5.8  HGB 16.1  HCT 47.5  PLT 212    ------------------------------------------------------------------------------------------------------------------  Chemistries   Recent Labs Lab 10/18/15 1000  NA 136  K 4.4  CL 102  CO2 24  GLUCOSE 119*  BUN 14  CREATININE 1.28*  CALCIUM 9.3   ------------------------------------------------------------------------------------------------------------------  Cardiac Enzymes  Recent Labs Lab 10/18/15 1000  TROPONINI 0.03   ------------------------------------------------------------------------------------------------------------------  RADIOLOGY:  Dg Chest 2 View  10/18/2015  CLINICAL DATA:  Left-sided chest pain radiating into left upper extremity EXAM: CHEST  2 VIEW COMPARISON:  July 14, 2008 FINDINGS: There is slight scarring in the left lower lobe. Lungs elsewhere are clear. Heart is upper normal in size with pulmonary vascularity within normal limits. No adenopathy. Patient is status post coronary artery bypass grafting. There are surgical clips in the posterior mid abdomen. IMPRESSION: Status post coronary artery bypass grafting. Mild scarring left lower lobe. No edema or consolidation. Electronically Signed   By: Bretta BangWilliam  Woodruff III M.D.   On: 10/18/2015 10:17    EKG:   Orders placed or performed during the hospital encounter of 10/18/15  . EKG 12-Lead  . EKG  12-Lead  . ED EKG within 10 minutes  . ED EKG within 10 minutes  . ED EKG  . ED EKG    IMPRESSION AND PLAN:   Tommy Kelly  is a 55 y.o. male with a known history of CAD status post CABG and multiple stents several years ago, hypertension and hyperlipidemia presents to hospital secondary to chest pressure that started this morning.  #1 Unstable angina- risk candidate. Prior CAD with CABG and stents. -Continue heparin drip and nitro drip due to ongoing chest pain -Elementary, recycle troponins -2-D echocardiogram ordered. Cardiology consulted. -NPO after midnight for possible cardiac  catheterization tomorrow -started on aspirin, statin, Plavix, metoprolol and losartan  #2 CKD- s/p right nephrectomy several years ago Stable, monitor especially if he needs cath  #3 HTN- restarted metoprolol, norvasc and losartan  #4 DVT prophylaxis- heparin drip    All the records are reviewed and case discussed with ED provider. Management plans discussed with the patient, family and they are in agreement.  CODE STATUS: Full Code  TOTAL TIME TAKING CARE OF THIS PATIENT: 50 minutes.    Enid Baas M.D on 10/18/2015 at 1:13 PM  Between 7am to 6pm - Pager - [phone]  After 6pm go to [email or web address] - password [email or web address] [facility]  [facility] [facility]  Office  [phone]  CC: Primary care physician; Julien Nordmann, MD
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
## 2015-10-18 NOTE — ED Notes (Signed)
Cardiologist at bedside.  

## 2015-10-18 NOTE — Consult Note (Signed)
Cardiology Consult    Patient ID: Caryl PinaJimmy Lee Cornick MRN: [id number], DOB/AGE: 55/06/1960   Admit date: 10/18/2015 Date of Consult: 10/18/2015  Primary Physician: Julien Nordmannimothy Gollan, MD Reason for Consult: Chest Pain Primary Cardiologist: Dr. Mariah MillingGollan Requesting Provider: Dr. Sharma CovertNorman   History of Present Illness    Chanetta MarshallJimmy Lazaro ArmsLee Asquith is a 55 y.o. male with past medical history of CAD (s/p CABG in 07/2007 w/ LIMA-LAD, SVG-dLAD, SVG-OM, and SVG-PDA), CKD (s/p R nephrectomy 1991), HTN, and HLD who [facility] for evaluation of chest pain.  He reports developing a chest discomfort on Sunday when going on a walk. It was associated with dyspnea. The pain resolved once he was home, sitting down resting. Since then, he has noticed recurrent episodes of similar chest discomfort, present with exertion, even with walking to his mailbox. This morning, when he went from sitting to standing and took 3 steps, he developed a "boom" sensation in his chest and says he all at once felt dizzy, had transient vision changes, and has experienced constant chest pain since. The pain has been reproducible with palpation and is worse with movements. He notes a tingling sensation going into his left mandible. Says his chest pain symptoms are similar to his 3 previous MI's (occurring prior to requiring CABG in 2009).   Upon arrival to the ED, his BP was found to be elevated to 194/110. He was started on IV NTG for his pain and BP control. Initial labs show a stable creatinine of 1.28. CBC with WBC of 5.8, Hgb 16.1, platelets 212. Initial troponin negative. CXR shows mild scarring in the left lower lobe with no edema or consolidation. EKG shows NSR, HR 87, up-sloping ST in anterior leads (present on previous tracings).   At the time of this encounter, he reports still having 7/10 chest pain, which is worse with palpation and movements. He appears anxious and reports several family members died of MI's.   He has  not been seen by Cardiology since 2015. He lost his disability and not being able to afford medications or co-pays for office visits. He has continued to take 81mg  ASA but no prescription medications (including Plavix, Amlodipine, Losartan, Lopressor, and Simvastatin).     Past Medical History   Past Medical History  Diagnosis Date  . Coronary artery disease     s/p CABG and multiple stents  . Hyperlipidemia   . Hypertension   . Chronic kidney disease     right kidney removed in 1992    Past Surgical History  Procedure Laterality Date  . Mi with stents    . 4 vessel bypass    . Vein bypass surgery    . Kidney surgery  1992    right kidney removed  . [facility]     Allergies  Allergies  Allergen Reactions  . Cats Claw [Uncaria Tomentosa (Cats Claw)]   . Dye Fdc Red [Red Dye]   . Fish Allergy   . Peanut-Containing Drug Products     Inpatient Medications    . morphine        Family History    Family History  Problem Relation Age of Onset  . Heart attack Mother   . Heart attack Father   . Heart attack Sister     Social History    Social History   Social History  . Marital Status: Single    Spouse Name: N/A  . Number of  Children: N/A  . Years of Education: N/A   Occupational History  . Not on file.   Social History Main Topics  . Smoking status: Never Smoker   . Smokeless tobacco: Not on file  . Alcohol Use: Yes     Comment: occassional  . Drug Use: No  . Sexual Activity: Not on file   Other Topics Concern  . Not on file   Social History Narrative     Review of Systems    General:  No chills, fever, night sweats or weight changes.  Cardiovascular:  No dyspnea on exertion, edema, orthopnea, palpitations, paroxysmal nocturnal dyspnea. Positive for chest pain. Dermatological: No rash, lesions/masses Respiratory: No cough, dyspnea Urologic: No hematuria, dysuria Abdominal:   No nausea, vomiting, diarrhea, bright red  blood per rectum, melena, or hematemesis Neurologic:  No visual changes, wkns, changes in mental status. Positive for dizziness and tingling sensation along left mandible. All other systems reviewed and are otherwise negative except as noted above.  Physical Exam    Blood pressure 194/87, pulse 89, temperature 97.7 F (36.5 C), temperature source Oral, resp. rate 15, height 6' (1.829 m), weight 210 lb (95.255 kg), SpO2 99 %.  General: Middle-aged Caucasian male, appearing anxious and borderline tearful. Psych: Normal affect. Neuro: Alert and oriented X 3. Moves all extremities spontaneously. HEENT: Normal  Neck: Supple without bruits or JVD. Lungs:  Resp regular and unlabored, CTA without wheezing or rales. Heart: RRR no s3, s4, or murmurs. Tender to palpation along left pectoral region. Abdomen: Soft, non-tender, non-distended, BS + x 4.  Extremities: No clubbing, cyanosis or edema. DP/PT/Radials 2+ and equal bilaterally.  Labs    Troponin (Point of Care Test) No results for input(s): TROPIPOC in the last 72 hours.  Recent Labs  10/18/15 1000  TROPONINI 0.03   Lab Results  Component Value Date   WBC 5.8 10/18/2015   HGB 16.1 10/18/2015   HCT 47.5 10/18/2015   MCV 82.0 10/18/2015   PLT 212 10/18/2015    Recent Labs Lab 10/18/15 1000  NA 136  K 4.4  CL 102  CO2 24  BUN 14  CREATININE 1.28*  CALCIUM 9.3  GLUCOSE 119*   No results found for: CHOL, HDL, LDLCALC, TRIG No results found for: [facility]   Radiology Studies    Dg Chest 2 View  10/18/2015  CLINICAL DATA:  Left-sided chest pain radiating into left upper extremity EXAM: CHEST  2 VIEW COMPARISON:  July 14, 2008 FINDINGS: There is slight scarring in the left lower lobe. Lungs elsewhere are clear. Heart is upper normal in size with pulmonary vascularity within normal limits. No adenopathy. Patient is status post coronary artery bypass grafting. There are surgical clips in the posterior mid abdomen. IMPRESSION:  Status post coronary artery bypass grafting. Mild scarring left lower lobe. No edema or consolidation. Electronically Signed   By: Bretta Bang III M.D.   On: 10/18/2015 10:17    EKG & Cardiac Imaging    EKG: NSR, HR 87, up-sloping ST in anterior leads (present on previous tracings).  Echocardiogram: 07/2012 Study Conclusions - Left ventricle: The cavity size was normal. There was mild concentric hypertrophy. Systolic function was mildly reduced. The estimated ejection fraction was 45%. Mild diffuse hypokinesis. there were no regional wall motion abnormalities. Doppler parameters are consistent with abnormal left ventricular relaxation (grade 1 diastolic dysfunction). - Mitral valve: Calcified annulus. - Left atrium: The atrium was mildly dilated. - Right ventricle: Systolic function was normal. - Tricuspid  valve: Mild-moderate regurgitation. - Pulmonary arteries: Systolic pressure was mildly elevated. PA peak pressure: 34mm Hg (S).  Assessment & Plan    1. Chest Pain with Mixed Typical and Atypical Features/ CAD - history of CAD, multiple MI's prior to 2009, s/p CABG in 07/2007 w/ LIMA-LAD, SVG-dLAD, SVG-OM, and SVG-PDA. Has not followed up with Cardiology in 2 years and quit taking his medications due to financial issues. - he describes two types of pain today: one being reproducible with palpation and worse with movements, overall atypical for a cardiac etiology and likely MSK. The other pain started on Sunday and starts with exertion, relieved by rest, and associated with dyspnea, concerning for unstable angina. - while in the ED, his initial troponin was negative. EKG shows NSR, HR 87, up-sloping ST in anterior leads (present on previous tracings).  Would cycle troponin values upon admission and obtain repeat EKG this evening and again tomorrow morning. - he has been started on Heparin and IV NTG while in the ED. Would restart ASA, Plavix, statin (high-intensity  dosing as below), and BB upon admission as he has not taken these for a few years. - patient says he cannot undergo stress testing due to claustrophobia. The second pain mentioned above is certainly concerning for a cardiac etiology. Discussed with Dr. Herbie Baltimore and will plan on a cardiac catheterization tomorrow. Orders will be entered and the cath lab has been notified (tentitatively scheduled with Dr. Kirke Corin on 10/19/2015 at 10:30AM). Of note, the patient says they have been unable to ever perform any invasive procedures through his wrist and always have to use the R groin.   2. Accelerated HTN - BP elevated to 194/110 upon arrival to the ED. Has not taken any medications for several years.  - placed on NTG drip. Restarted on Amlodipine, Losartan, and Lopressor at time of admission.  3. HLD - check Lipid Panel.  - will switch to high-intensity Atorvastatin  daily.  4. Stage 2 CKD - s/p R nephrectomy in 1991.  - creatinine at 1.28 on admission. Continue to monitor. - will hydrate for cardiac catheterization starting tomorrow morning.  5. History of Chronic Combined Systolic and Diastolic CHF - EF was 45% by echo in 07/2012. Repeat echocardiogram to assess LV function and wall motion is pending. - continue newly restarted ARB and BB.    Signed, Ellsworth Lennox, PA-C 10/18/2015, 1:16 PM Pager: [phone]

## 2015-10-18 NOTE — Progress Notes (Signed)
Pt continues to c/o pain 6/10- pt nsr on tele/ no distress noted/ resting in bed comfortable watching TV/ Dr. Nemiah CommanderKalisetti paged to make aware/ states his goal for pain is around 5/6/ no need to titrate nitro gtt / continues to infuse at 430mcg/ also ordered to hold BP medications due to pt being on gtt/ will monitor closely and administer PRN pain med when time permits

## 2015-10-18 NOTE — Progress Notes (Addendum)
ANTICOAGULATION CONSULT NOTE - Initial Consult  Pharmacy Consult for Heparin drip Indication: chest pain/ACS  Allergies  Allergen Reactions  . Cats Claw [Uncaria Tomentosa (Cats Claw)]   . Dye Fdc Red [Red Dye]   . Fish Allergy   . Peanut-Containing Drug Products     Patient Measurements: Height: 6' (182.9 cm) Weight: 210 lb (95.255 kg) IBW/kg (Calculated) : 77.6 Heparin Dosing Weight: 95.3 kg  Vital Signs: Temp: 97.7 F (36.5 C) (06/14 0957) Temp Source: Oral (06/14 0957) BP: 188/84 mmHg (06/14 1112) Pulse Rate: 85 (06/14 1112)  Labs:  Recent Labs  10/18/15 1000  HGB 16.1  HCT 47.5  PLT 212  CREATININE 1.28*  TROPONINI 0.03    Estimated Creatinine Clearance: 78.1 mL/min (by C-G formula based on Cr of 1.28).   Medical History: Past Medical History  Diagnosis Date  . Coronary artery disease   . Hyperlipidemia   . Hypertension   . Chronic kidney disease     right kidney removed in 1992    Medications:  Scheduled:  . aspirin EC  243 mg Oral Once  . heparin  4,000 Units Intravenous Once  . ondansetron (ZOFRAN) IV  4 mg Intravenous Once   Infusions:  . heparin      Assessment: 55 yo male to start Heparin drip for chest pain,ACS/STEMI?. Patient on ASA/plavix at home. Hx of MI.  hgb 16.1  plt 212 INR 1.09 APTT  31  Goal of Therapy:  Heparin level 0.3-0.7 units/ml Monitor platelets by anticoagulation protocol: Yes   Plan:  Give 4000 units bolus x 1 Start heparin infusion at 1100 units/hr Check anti-Xa level in 1800 hours and daily while on heparin Continue to monitor H&H and platelets  [name] A 10/18/2015,11:34 AM

## 2015-10-18 NOTE — Progress Notes (Addendum)
ANTICOAGULATION CONSULT NOTE - Initial Consult  Pharmacy Consult for Heparin drip Indication: chest pain/ACS  Allergies  Allergen Reactions  . Cats Claw [Uncaria Tomentosa (Cats Claw)]   . Dye Fdc Red [Red Dye]   . Fish Allergy   . Peanut-Containing Drug Products     Patient Measurements: Height: 6' (182.9 cm) Weight: 212 lb 11.2 oz (96.48 kg) IBW/kg (Calculated) : 77.6 Heparin Dosing Weight: 95.3 kg  Vital Signs: Temp: 98.2 F (36.8 C) (06/14 1524) Temp Source: Oral (06/14 0957) BP: 137/60 mmHg (06/14 1656) Pulse Rate: 74 (06/14 1656)  Labs:  Recent Labs  10/18/15 1000 10/18/15 1250 10/18/15 1534 10/18/15 1759  HGB 16.1  --   --   --   HCT 47.5  --   --   --   PLT 212  --   --   --   APTT 31  --   --   --   LABPROT 14.3  --   --   --   INR 1.09  --   --   --   HEPARINUNFRC  --   --   --  0.24*  CREATININE 1.28*  --   --   --   TROPONINI 0.03 0.04* 0.04*  --     Estimated Creatinine Clearance: 78.6 mL/min (by C-G formula based on Cr of 1.28).   Medical History: Past Medical History  Diagnosis Date  . Coronary artery disease     a. s/p CABG in 07/2007 w/ LIMA-LAD, SVG-dLAD, SVG-OM, and SVG-PDA.  Marland Kitchen. Hyperlipidemia   . Hypertension   . Chronic kidney disease     a. 1991 s/p R nephrectomy   . Chronic combined systolic and diastolic CHF (congestive heart failure) (HCC)     a. Echo 07/2012: EF 45% w/ Grade 1 DD. Mild to moderate TR. PA Pressure 34.    Medications:  Scheduled:  . amLODipine  10 mg Oral Daily  . [START ON 10/19/2015] aspirin  81 mg Oral Pre-Cath  . [START ON 10/19/2015] aspirin EC  81 mg Oral Daily  . atorvastatin  80 mg Oral q1800  . ciprofloxacin-dexamethasone  4 drop Left Ear BID  . clopidogrel  75 mg Oral Daily  . heparin  1,400 Units Intravenous Once  . losartan  100 mg Oral Daily  . metoprolol tartrate  12.5 mg Oral BID  . sodium chloride flush  3 mL Intravenous Q12H  . sodium chloride flush  3 mL Intravenous Q12H   Infusions:  .  sodium chloride 75 mL/hr at 10/18/15 1558  . [START ON 10/19/2015] sodium chloride     Followed by  . [START ON 10/19/2015] sodium chloride    . heparin 1,100 Units/hr (10/18/15 1154)  . nitroGLYCERIN      Assessment: 55 yo male to start Heparin drip for chest pain,ACS/STEMI?. Patient on ASA/plavix at home. Hx of MI.  hgb 16.1  plt 212 INR 1.09 APTT  31  Goal of Therapy:  Heparin level 0.3-0.7 units/ml Monitor platelets by anticoagulation protocol: Yes   Plan:  Give 4000 units bolus x 1 Start heparin infusion at 1100 units/hr Check anti-Xa level in 1800 hours and daily while on heparin Continue to monitor H&H and platelets   6/14:  HL @ 18:00 = 0.24.   Will order heparin 1400 units IV X 1 bolus and increase drip rate to 1300 units/hr.  Will recheck HL 6 hrs after rate change on 6/15 @ 0130.   Deshawn Skelley D 10/18/2015,7:07 PM

## 2015-10-18 NOTE — ED Provider Notes (Signed)
[facility] Emergency Department Provider Note  ____________________________________________  Time seen: Approximately 11:25 AM  I have reviewed the triage vital signs and the nursing notes.   HISTORY  Chief Complaint Chest Pain and Tachycardia    HPI Tommy Kelly is a 55 y.o. male with a history of CAD S/P stents and CABG remotely, HTN, HL, off all of his medications times one year, presenting with chest pain. The patient reports he was reading the newspaper at 9:30 AM when he had "3 pounding beats" in his chest. He stood up and when he tried to walk he became lightheaded like he might faint, and had associated shortness of breath, diaphoresis, nausea.He then developed radiation of his chest pain to the left axilla, the left upper extremity, and tingling in the left jaw. This feels similar to his previous 3 heart attacks. He took 81 mg of aspirin at home.  No recent use of erectile dysfunction drugs.  SH: denies cocaine   Past Medical History  Diagnosis Date  . Coronary artery disease   . Hyperlipidemia   . Hypertension   . Chronic kidney disease     right kidney removed in 1992    Patient Active Problem List   Diagnosis Date Noted  . Bronchitis 08/23/2013  . Leg edema 10/28/2012  . Syncope 10/28/2012  . Chest pain 07/17/2012  . Upper respiratory infection 04/28/2012  . H/O medication noncompliance 01/31/2012  . Shortness of breath 09/17/2011  . Angina at rest [facility]) 09/17/2011  . S/P CABG x 4 09/17/2011  . Hyperlipidemia 09/17/2011  . Hypertension 09/17/2011    Past Surgical History  Procedure Laterality Date  . Mi with stents    . 4 vessel bypass    . Vein bypass surgery    . Kidney surgery  1992    right kidney removed  . Cardiac catheterization  2009    [facility]    Current Outpatient Rx  Name  Route  Sig  Dispense  Refill  . aspirin 81 MG tablet   Oral   Take 81 mg by mouth daily.          Marland Kitchen amLODipine (NORVASC) 10 MG  tablet   Oral   Take 1 tablet (10 mg total) by mouth daily.   30 tablet   3   . clopidogrel (PLAVIX) 75 MG tablet   Oral   Take 1 tablet (75 mg total) by mouth daily.   30 tablet   3   . losartan (COZAAR) 100 MG tablet   Oral   Take 1 tablet (100 mg total) by mouth daily.   30 tablet   11   . metoprolol tartrate (LOPRESSOR) 25 MG tablet   Oral   Take 12.5 mg by mouth 2 (two) times daily.         . simvastatin (ZOCOR) 40 MG tablet      TAKE 1 TABLET (40 MG TOTAL) BY MOUTH EVERY EVENING.   90 tablet   3   . tadalafil (CIALIS) 5 MG tablet   Oral   Take 1 tablet (5 mg total) by mouth daily as needed for erectile dysfunction.   30 tablet   7     Allergies Cats claw; Dye fdc red; Fish allergy; and Peanut-containing drug products  Family History  Problem Relation Age of Onset  . Heart attack Mother   . Heart attack Father   . Heart attack Sister     Social History Social History  Substance  Use Topics  . Smoking status: Never Smoker   . Smokeless tobacco: None  . Alcohol Use: Yes     Comment: occassional    Review of Systems Constitutional: No fever/chills.Positive lightheadedness. Negative syncope. Positive diaphoresis. Eyes: No visual changes. ENT: No sore throat. No congestion or rhinorrhea. Cardiovascular: Positive chest pain. Positive palpitations. Respiratory: Positive shortness of breath.  No cough. Gastrointestinal: No abdominal pain.  No nausea, no vomiting.  No diarrhea.  No constipation. Genitourinary: Negative for dysuria. Musculoskeletal: Negative for back pain. No lower extremity swelling or calf pain. Skin: Negative for rash. Neurological: Negative for headaches. No focal numbness, tingling or weakness.   10-point ROS otherwise negative.  ____________________________________________   PHYSICAL EXAM:  VITAL SIGNS: ED Triage Vitals  Enc Vitals Group     BP 10/18/15 0957 165/102 mmHg     Pulse Rate 10/18/15 0957 87     Resp 10/18/15  0957 14     Temp 10/18/15 0957 97.7 F (36.5 C)     Temp Source 10/18/15 0957 Oral     SpO2 10/18/15 0957 97 %     Weight 10/18/15 0957 210 lb (95.255 kg)     Height 10/18/15 0957 6' (1.829 m)     Head Cir --      Peak Flow --      Pain Score 10/18/15 0957 7     Pain Loc --      Pain Edu? --      Excl. in GC? --     Constitutional: Alert and oriented. Nontoxic. Answers questions appropriately. Eyes: Conjunctivae are normal.  EOMI. No scleral icterus. Head: Atraumatic. Nose: No congestion/rhinnorhea. Mouth/Throat: Mucous membranes are moist.  Neck: No stridor.  Supple.  Mild JVD. Cardiovascular: Normal rate, regular rhythm. No murmurs, rubs or gallops.  Respiratory: Normal respiratory effort.  No accessory muscle use or retractions. Lungs CTAB.  No wheezes, rales or ronchi. Gastrointestinal: Soft, nontender and nondistended.  No guarding or rebound.  No peritoneal signs. Musculoskeletal: No LE edema. No ttp in the calves or palpable cords.  Negative Homan's sign. Neurologic:  A&Ox3.  Speech is clear.  Face and smile are symmetric.  EOMI.  Moves all extremities well. Skin:  Skin is warm, dry and intact. No rash noted. Psychiatric: Mood and affect are normal. Speech and behavior are normal.  Normal judgement.  ____________________________________________   LABS (all labs ordered are listed, but only abnormal results are displayed)  Labs Reviewed  BASIC METABOLIC PANEL - Abnormal; Notable for the following:    Glucose, Bld 119 (*)    Creatinine, Ser 1.28 (*)    All other components within normal limits  CBC  TROPONIN I  PROTIME-INR  APTT  HEPARIN LEVEL (UNFRACTIONATED)  TROPONIN I   ____________________________________________  EKG  ED ECG REPORT I, Rockne MenghiniNorman, Anne-Caroline, the attending physician, personally viewed and interpreted this ECG.   Date: 10/18/2015  EKG Time: 950  Rate: 87  Rhythm: normal sinus rhythm  Axis: normal  Intervals:none  ST&T Change: 1mm ST  elevation in V1 and V2; does not meet criteria for STEMI but is concerning for active ischemia.  This EKG is compared to 2015 EKG from Dr. Marylou FlesherGolan's office and is grossly unchanged in morphology, including 1mm ST elevation present.  ____________________________________________  RADIOLOGY  Dg Chest 2 View  10/18/2015  CLINICAL DATA:  Left-sided chest pain radiating into left upper extremity EXAM: CHEST  2 VIEW COMPARISON:  July 14, 2008 FINDINGS: There is slight scarring in the left  lower lobe. Lungs elsewhere are clear. Heart is upper normal in size with pulmonary vascularity within normal limits. No adenopathy. Patient is status post coronary artery bypass grafting. There are surgical clips in the posterior mid abdomen. IMPRESSION: Status post coronary artery bypass grafting. Mild scarring left lower lobe. No edema or consolidation. Electronically Signed   By: Bretta Bang III M.D.   On: 10/18/2015 10:17    ____________________________________________   PROCEDURES  Procedure(s) performed: None  Critical Care performed: Yes ____________________________________________   INITIAL IMPRESSION / ASSESSMENT AND PLAN / ED COURSE  Pertinent labs & imaging results that were available during my care of the patient were reviewed by me and considered in my medical decision making (see chart for details).  55 y.o. male with a history of CAD status post stent placement and CABG remotely, off his medications times one year, presenting with acute onset of chest pain, jaw tingling, diaphoresis, shortness of breath. The patient is hypertensive on my exam but nontoxic. I am concerned given his history and presentation today about ACS or MI. I will immediately initiate aspirin and heparin, and repeat his EKG for reevaluation. It is less likely that the patient has aortic dissection or PE, and he has no symptoms that would be consistent with an acute infectious process. GI illness is also possible but  again less likely. At this time, we will proceed with treatment for ACS/unstable angina.  CRITICAL CARE Performed by: Rockne Menghini   Total critical care time: 35 minutes  Critical care time was exclusive of separately billable procedures and treating other patients.  Critical care was necessary to treat or prevent imminent or life-threatening deterioration.  Critical care was time spent personally by me on the following activities: development of treatment plan with patient and/or surrogate as well as nursing, discussions with consultants, evaluation of patient's response to treatment, examination of patient, obtaining history from patient or surrogate, ordering and performing treatments and interventions, ordering and review of laboratory studies, ordering and review of radiographic studies, pulse oximetry and re-evaluation of patient's condition.  ----------------------------------------- 11:53 AM on 10/18/2015 -----------------------------------------  I performed a repeat EKG which shows the following: ED ECG REPORT I, Rockne Menghini, the attending physician, personally viewed and interpreted this ECG.   Date: 10/18/2015  EKG Time: 1134  Rate: 76  Rhythm: normal sinus rhythm  Axis: Normal  Intervals:none  ST&T Change: No STEMI  This EKG is grossly unchanged from the previous.  I've spoken with Dr. Herbie Baltimore, who is on call for Dr. Marylou Flesher (primary cardiologist for this pt) today.  We have discussed aggressive management with much glycerin drip and heparin bolus and drip versus cardiac catheterization. At this time, the patient's EKG findings are not consistent with STEMI and we will proceed with aggressive medication management. Dr. Herbie Baltimore will come evaluate the patient in the emergency department, and we will continue to closely monitor the patient's symptoms and EKG progression/troponins for final  management.    ____________________________________________  FINAL CLINICAL IMPRESSION(S) / ED DIAGNOSES  Final diagnoses:  Unstable angina (HCC)      NEW MEDICATIONS STARTED DURING THIS VISIT:  New Prescriptions   No medications on file     Rockne Menghini, MD 10/18/15 1230

## 2015-10-18 NOTE — Care Management Obs Status (Signed)
MEDICARE OBSERVATION STATUS NOTIFICATION   Patient Details  Name: Tommy Kelly MRN: [id number] Date of Birth: 06/06/1960   Medicare Observation Status Notification Given:  Yes    Eber HongGreene, Alayssa Flinchum R, RN 10/18/2015, 4:46 PM

## 2015-10-18 NOTE — Progress Notes (Signed)
CSW received consult. RNCM informed that patient needs medication assistance. CSW is signing off. CSW is available if a CSW need were to arise.  Woodroe Modehristina Kenson Groh, MSW, LCSW-A Clinical Social Work Department [phone]

## 2015-10-19 ENCOUNTER — Encounter
Admission: EM | Disposition: A | Discharge status: Home / Self Care | Payer: Self-pay | Source: Home / Self Care | Attending: Emergency Medicine

## 2015-10-19 ENCOUNTER — Encounter: Payer: Self-pay | Admitting: Cardiovascular Disease

## 2015-10-19 DIAGNOSIS — I25110 Atherosclerotic heart disease of native coronary artery with unstable angina pectoris: Secondary | ICD-10-CM

## 2015-10-19 HISTORY — PX: CARDIAC CATHETERIZATION: SHX172

## 2015-10-19 LAB — CBC
HCT: 42.1 % (ref 40.0–52.0)
Hemoglobin: 14 g/dL (ref 13.0–18.0)
MCH: 27.9 pg (ref 26.0–34.0)
MCHC: 33.3 g/dL (ref 32.0–36.0)
MCV: 83.8 fL (ref 80.0–100.0)
PLATELETS: 163 10*3/uL (ref 150–440)
RBC: 5.03 MIL/uL (ref 4.40–5.90)
RDW: 13.9 % (ref 11.5–14.5)
WBC: 8.4 10*3/uL (ref 3.8–10.6)

## 2015-10-19 LAB — HEMOGLOBIN A1C: Hgb A1c MFr Bld: 5.7 % (ref 4.0–6.0)

## 2015-10-19 LAB — HEPARIN LEVEL (UNFRACTIONATED)
HEPARIN UNFRACTIONATED: 0.42 [IU]/mL (ref 0.30–0.70)
Heparin Unfractionated: 0.4 IU/mL (ref 0.30–0.70)

## 2015-10-19 LAB — MAGNESIUM: Magnesium: 1.9 mg/dL (ref 1.7–2.4)

## 2015-10-19 LAB — ECHOCARDIOGRAM COMPLETE
Height: 72 in
WEIGHTICAEL: 3403.2 [oz_av]

## 2015-10-19 LAB — TROPONIN I: TROPONIN I: 0.03 ng/mL (ref <0.031)

## 2015-10-19 SURGERY — LEFT HEART CATH AND CORS/GRAFTS ANGIOGRAPHY

## 2015-10-19 MED ORDER — SODIUM CHLORIDE 0.9% FLUSH: 3.0000 mL | INTRAVENOUS | Status: DC | PRN

## 2015-10-19 MED ORDER — FENTANYL CITRATE (PF) 100 MCG/2ML IJ SOLN
INTRAMUSCULAR | Status: DC | PRN
  Administered 2015-10-19: 25 ug via INTRAVENOUS

## 2015-10-19 MED ORDER — FENTANYL CITRATE (PF) 100 MCG/2ML IJ SOLN
INTRAMUSCULAR | Status: AC
  Filled 2015-10-19: qty 2

## 2015-10-19 MED ORDER — DIPHENHYDRAMINE HCL 25 MG PO CAPS
50.0000 mg | ORAL_CAPSULE | Freq: Once | ORAL | Status: AC
  Administered 2015-10-19: 50 mg via ORAL
  Filled 2015-10-19: qty 2

## 2015-10-19 MED ORDER — SODIUM CHLORIDE 0.9 % IV SOLN: 250.0000 mL | INTRAVENOUS | Status: DC | PRN

## 2015-10-19 MED ORDER — CARVEDILOL 3.125 MG PO TABS
3.1250 mg | ORAL_TABLET | Freq: Two times a day (BID) | ORAL | Status: DC
  Administered 2015-10-19: 3.125 mg via ORAL
  Filled 2015-10-19: qty 1

## 2015-10-19 MED ORDER — IOPAMIDOL (ISOVUE-300) INJECTION 61%
INTRAVENOUS | Status: DC | PRN
  Administered 2015-10-19: 105 mL via INTRA_ARTERIAL

## 2015-10-19 MED ORDER — OXYCODONE-ACETAMINOPHEN 5-325 MG PO TABS
1.0000 | ORAL_TABLET | Freq: Four times a day (QID) | ORAL | Status: DC | PRN
  Administered 2015-10-19 (×2): 1 via ORAL
  Administered 2015-10-20 (×3): 2 via ORAL
  Filled 2015-10-19: qty 1
  Filled 2015-10-19 (×2): qty 2
  Filled 2015-10-19: qty 1
  Filled 2015-10-19: qty 2

## 2015-10-19 MED ORDER — HEPARIN SODIUM (PORCINE) 1000 UNIT/ML IJ SOLN
INTRAMUSCULAR | Status: AC
  Filled 2015-10-19: qty 1

## 2015-10-19 MED ORDER — SODIUM CHLORIDE 0.9% FLUSH: 3.0000 mL | Freq: Two times a day (BID) | INTRAVENOUS | Status: DC

## 2015-10-19 MED ORDER — SODIUM CHLORIDE 0.9 % IV SOLN
INTRAVENOUS | Status: AC
  Administered 2015-10-19: 16:00:00 via INTRAVENOUS

## 2015-10-19 MED ORDER — HEPARIN (PORCINE) IN NACL 2-0.9 UNIT/ML-% IJ SOLN
INTRAMUSCULAR | Status: AC
  Filled 2015-10-19: qty 1000

## 2015-10-19 MED ORDER — VERAPAMIL HCL 2.5 MG/ML IV SOLN
INTRAVENOUS | Status: AC
  Filled 2015-10-19: qty 2

## 2015-10-19 MED ORDER — MIDAZOLAM HCL 2 MG/2ML IJ SOLN
INTRAMUSCULAR | Status: AC
  Filled 2015-10-19: qty 2

## 2015-10-19 MED ORDER — MIDAZOLAM HCL 2 MG/2ML IJ SOLN
INTRAMUSCULAR | Status: DC | PRN
  Administered 2015-10-19: 1 mg via INTRAVENOUS

## 2015-10-19 MED ORDER — METHYLPREDNISOLONE SODIUM SUCC 125 MG IJ SOLR
125.0000 mg | Freq: Once | INTRAMUSCULAR | Status: AC
  Administered 2015-10-19: 125 mg via INTRAVENOUS
  Filled 2015-10-19: qty 2

## 2015-10-19 MED ORDER — AMLODIPINE BESYLATE 5 MG PO TABS: 5.0000 mg | ORAL_TABLET | Freq: Every day | ORAL | Status: DC

## 2015-10-19 MED ORDER — MORPHINE SULFATE (PF) 4 MG/ML IV SOLN: 2.0000 mg | Freq: Four times a day (QID) | INTRAVENOUS | Status: DC | PRN

## 2015-10-19 MED ORDER — ENOXAPARIN SODIUM 40 MG/0.4ML ~~LOC~~ SOLN
40.0000 mg | SUBCUTANEOUS | Status: DC
  Administered 2015-10-19: 40 mg via SUBCUTANEOUS
  Filled 2015-10-19: qty 0.4

## 2015-10-19 MED ORDER — NITROGLYCERIN IN D5W 200-5 MCG/ML-% IV SOLN: 0.0000 ug/min | INTRAVENOUS | Status: DC

## 2015-10-19 SURGICAL SUPPLY — 11 items
CATH 5F 110X4 TIG (CATHETERS) ×2 IMPLANT
CATH INFINITI 5 FR IM (CATHETERS) ×2 IMPLANT
CATH INFINITI 5 FR MPA2 (CATHETERS) ×2 IMPLANT
CATH INFINITI 5FR ANG PIGTAIL (CATHETERS) ×2 IMPLANT
CATH INFINITI 5FR JL4 (CATHETERS) ×2 IMPLANT
CATH INFINITI JR4 5F (CATHETERS) ×2 IMPLANT
DEVICE RAD TR BAND REGULAR (VASCULAR PRODUCTS) ×2 IMPLANT
GLIDESHEATH SLEND SS 6F .021 (SHEATH) ×2 IMPLANT
KIT MANI 3VAL PERCEP (MISCELLANEOUS) ×3 IMPLANT
PACK CARDIAC CATH (CUSTOM PROCEDURE TRAY) ×3 IMPLANT
WIRE SAFE-T 1.5MM-J .035X260CM (WIRE) ×2 IMPLANT

## 2015-10-19 NOTE — Progress Notes (Signed)
Patient: Tommy Kelly / Admit Date: 10/18/2015 / Date of Encounter: 10/19/2015, 7:17 AM   Subjective: Continued 4 to 6/10 chest pain along left upper outer chest. Has one episode of sharp chest pain overnight s/p rolling over in the bed. No associated symptoms at this time. He is for cardiac cath this morning with Dr. Kirke Corin, MD. BP improved.   Review of Systems: Review of Systems  Constitutional: Positive for malaise/fatigue. Negative for fever, chills, weight loss and diaphoresis.  HENT: Negative for congestion.   Eyes: Negative for discharge and redness.  Respiratory: Negative for cough, hemoptysis, sputum production, shortness of breath and wheezing.   Cardiovascular: Positive for chest pain. Negative for palpitations, orthopnea, claudication, leg swelling and PND.  Gastrointestinal: Negative for nausea, vomiting and abdominal pain.  Musculoskeletal: Negative for falls.  Skin: Negative for rash.  Neurological: Negative for dizziness, sensory change, speech change, focal weakness, loss of consciousness and weakness.  Endo/Heme/Allergies: Does not bruise/bleed easily.  Psychiatric/Behavioral: The patient is nervous/anxious.   All other systems reviewed and are negative.   Objective: Telemetry: NSR, 70's, 4 beats NSVT Physical Exam: Blood pressure 125/66, pulse 68, temperature 99 F (37.2 C), temperature source Oral, resp. rate 18, height 6' (1.829 m), weight 212 lb 11.2 oz (96.48 kg), SpO2 99 %. Body mass index is 28.84 kg/(m^2). General: Well developed, well nourished, in no acute distress. Head: Normocephalic, atraumatic, sclera non-icteric, no xanthomas, nares are without discharge. Neck: Negative for carotid bruits. JVP not elevated. Lungs: Clear bilaterally to auscultation without wheezes, rales, or rhonchi. Breathing is unlabored. Heart: RRR S1 S2 without murmurs, rubs, or gallops. Chest pain is reproducible to palpation on exam.  Abdomen: Soft, non-tender,  non-distended with normoactive bowel sounds. No rebound/guarding. Extremities: No clubbing or cyanosis. No edema. Distal pedal pulses are 2+ and equal bilaterally. Neuro: Alert and oriented X 3. Moves all extremities spontaneously. Psych:  Responds to questions appropriately with a normal affect.   Intake/Output Summary (Last 24 hours) at 10/19/15 0717 Last data filed at 10/19/15 0700  Gross per 24 hour  Intake 1330.58 ml  Output    600 ml  Net 730.58 ml    Inpatient Medications:  . amLODipine  10 mg Oral Daily  . aspirin EC  81 mg Oral Daily  . atorvastatin  80 mg Oral q1800  . ciprofloxacin-dexamethasone  4 drop Left Ear BID  . clopidogrel  75 mg Oral Daily  . losartan  100 mg Oral Daily  . metoprolol tartrate  12.5 mg Oral BID  . sodium chloride flush  3 mL Intravenous Q12H  . sodium chloride flush  3 mL Intravenous Q12H   Infusions:  . sodium chloride 75 mL/hr at 10/18/15 1900  . sodium chloride    . heparin 1,300 Units/hr (10/19/15 0431)  . nitroGLYCERIN      Labs:  Recent Labs  10/18/15 1000 10/19/15 0200  NA 136  --   K 4.4  --   CL 102  --   CO2 24  --   GLUCOSE 119*  --   BUN 14  --   CREATININE 1.28*  --   CALCIUM 9.3  --   MG  --  1.9   No results for input(s): AST, ALT, ALKPHOS, BILITOT, PROT, ALBUMIN in the last 72 hours.  Recent Labs  10/18/15 1000 10/19/15 0200  WBC 5.8 8.4  HGB 16.1 14.0  HCT 47.5 42.1  MCV 82.0 83.8  PLT 212 163    Recent  Labs  10/18/15 1250 10/18/15 1534 10/18/15 2051 10/19/15 0200  TROPONINI 0.04* 0.04* 0.04* 0.03   Invalid input(s): POCBNP  Recent Labs  10/18/15 1534  HGBA1C 5.7     Weights: Filed Weights   10/18/15 0957 10/18/15 1524  Weight: 210 lb (95.255 kg) 212 lb 11.2 oz (96.48 kg)     Radiology/Studies:  Dg Chest 2 View  10/18/2015  CLINICAL DATA:  Left-sided chest pain radiating into left upper extremity EXAM: CHEST  2 VIEW COMPARISON:  July 14, 2008 FINDINGS: There is slight scarring  in the left lower lobe. Lungs elsewhere are clear. Heart is upper normal in size with pulmonary vascularity within normal limits. No adenopathy. Patient is status post coronary artery bypass grafting. There are surgical clips in the posterior mid abdomen. IMPRESSION: Status post coronary artery bypass grafting. Mild scarring left lower lobe. No edema or consolidation. Electronically Signed   By: Bretta BangWilliam  Woodruff III M.D.   On: 10/18/2015 10:17     Assessment and Plan  Principal Problem:   Unstable angina (HCC) Active Problems:   CAD, S/P CABG x 4 w/ LIMA-LAD, SVG-dLAD, SVG-OM, and SVG-PDA   Hyperlipidemia   Chest pain with moderate risk for cardiac etiology   CKD (chronic kidney disease) stage 2, GFR 60-89 ml/min   Accelerated hypertension    1.Chest pain with mixed typical and atypical features/CAD s/p CABG: -He is for cardiac cath this morning with Dr. Kirke CorinArida, MD (unable to undergo stress test 2/2 claustrophobia) -Continue heparin gtt -Troponin peaked at 0.04-->0.03 currently. Chest pain is reproducible to palpation on exam this morning -Currently with 4 to 6/10 chest pain on nitro gtt and requiring morphine q 4 hours -Risks and benefits of cardiac catheterization have been discussed with the patient including risks of bleeding, bruising, infection, kidney damage, stroke, heart attack, and death. The patient understands these risks and is willing to proceed with the procedure. All questions have been answered and concerns listened to -Aspirin and Plavix -Lopressor, losartan, Lipitor -Echo pending -Patient reports only able to obtain access via R groin -Limit contrast given patient is s/p R nephrectomy and only has one kidney   2. Accelerated HTN: -Much improved -Continue current antihypertensives  3. HLD: -Not at goal -Restarted on Lipitor upon admission -Will need recheck lipid and LFT at outpatient follow up -If LDL remains not at goal consider starting process for PCKS9  inhibitor   4. CKD stage II/status post right nephrectomy in 1991: -SCr stable on 6/14 -No bmet at this time -Limit IV contrast   5. Chronic combined CHF: -EF was 45% on 07/2012 by echo -Repeat echo pending -He does not appear to be volume overloaded at this time -Restarted on ARB/BB   Signed, Carola FrostRyan Devory Mckinzie, PA-C [facility] Pager: [phone] 10/19/2015, 7:17 AM

## 2015-10-19 NOTE — Progress Notes (Signed)
ANTICOAGULATION CONSULT NOTE - Initial Consult  Pharmacy Consult for Heparin drip Indication: chest pain/ACS  Allergies  Allergen Reactions  . Cats Claw [Uncaria Tomentosa (Cats Claw)]   . Dye Fdc Red [Red Dye]   . Fish Allergy   . Peanut-Containing Drug Products     Patient Measurements: Height: 6' (182.9 cm) Weight: 212 lb 11.2 oz (96.48 kg) IBW/kg (Calculated) : 77.6 Heparin Dosing Weight: 95.3 kg  Vital Signs: Temp: 98.2 F (36.8 C) (06/14 1918) Temp Source: Oral (06/14 1918) BP: 117/73 mmHg (06/15 0212) Pulse Rate: 77 (06/14 1918)  Labs:  Recent Labs  10/18/15 1000  10/18/15 1534 10/18/15 1759 10/18/15 2051 10/19/15 0200  HGB 16.1  --   --   --   --  14.0  HCT 47.5  --   --   --   --  42.1  PLT 212  --   --   --   --  163  APTT 31  --   --   --   --   --   LABPROT 14.3  --   --   --   --   --   INR 1.09  --   --   --   --   --   HEPARINUNFRC  --   --   --  0.24*  --  0.42  CREATININE 1.28*  --   --   --   --   --   TROPONINI 0.03  < > 0.04*  --  0.04* 0.03  < > = values in this interval not displayed.  Estimated Creatinine Clearance: 78.6 mL/min (by C-G formula based on Cr of 1.28).   Medical History: Past Medical History  Diagnosis Date  . Coronary artery disease     a. s/p CABG in 07/2007 w/ LIMA-LAD, SVG-dLAD, SVG-OM, and SVG-PDA.  Marland Kitchen Hyperlipidemia   . Hypertension   . Chronic kidney disease     a. 1991 s/p R nephrectomy   . Chronic combined systolic and diastolic CHF (congestive heart failure) (HCC)     a. Echo 07/2012: EF 45% w/ Grade 1 DD. Mild to moderate TR. PA Pressure 34.    Medications:  Scheduled:  . amLODipine  10 mg Oral Daily  . aspirin  81 mg Oral Pre-Cath  . aspirin EC  81 mg Oral Daily  . atorvastatin  80 mg Oral q1800  . ciprofloxacin-dexamethasone  4 drop Left Ear BID  . clopidogrel  75 mg Oral Daily  . losartan  100 mg Oral Daily  . metoprolol tartrate  12.5 mg Oral BID  . sodium chloride flush  3 mL Intravenous Q12H   . sodium chloride flush  3 mL Intravenous Q12H   Infusions:  . sodium chloride 75 mL/hr at 10/18/15 1558  . sodium chloride     Followed by  . sodium chloride    . heparin 1,300 Units/hr (10/18/15 1922)  . nitroGLYCERIN      Assessment: 55 yo male to start Heparin drip for chest pain,ACS/STEMI?. Patient on ASA/plavix at home. Hx of MI.  hgb 16.1  plt 212 INR 1.09 APTT  31  Goal of Therapy:  Heparin level 0.3-0.7 units/ml Monitor platelets by anticoagulation protocol: Yes   Plan:  Give 4000 units bolus x 1 Start heparin infusion at 1100 units/hr Check anti-Xa level in 1800 hours and daily while on heparin Continue to monitor H&H and platelets   6/14:  HL @ 18:00 = 0.24.   Will order  heparin 1400 units IV X 1 bolus and increase drip rate to 1300 units/hr.  Will recheck HL 6 hrs after rate change on 6/15 @ 0130.   6/15 02:00 heparin level 0.42. Recheck in 6 hours to confirm.  Caulder Wehner S 10/19/2015,2:54 AM

## 2015-10-19 NOTE — Progress Notes (Signed)
Percocet 1 given for chest tightness. 50 minutes later pain is unchanged.  2nd percocet given.

## 2015-10-19 NOTE — Progress Notes (Signed)
[facility] - [place] at [facility]   PATIENT NAME: Tommy Kelly    MR#:  [id number]  DATE OF BIRTH:  1960-05-14  SUBJECTIVE:  CHIEF COMPLAINT:   Chief Complaint  Patient presents with  . Chest Pain  . Tachycardia   -Patient with severe coronary artery disease status post bypass surgery and prior stents, not taking any medications for more than a year presents with chest pain. -Status post cardiac catheterization this afternoon showing severe three-vessel disease but patent grafts and medical management recommended. -Patient still complains of chest pain but is reproducible to touch  REVIEW OF SYSTEMS:  Review of Systems  Constitutional: Negative for fever, chills and malaise/fatigue.  HENT: Negative for ear discharge, ear pain and tinnitus.   Eyes: Negative for blurred vision and double vision.  Respiratory: Negative for cough, shortness of breath and wheezing.   Cardiovascular: Positive for chest pain. Negative for palpitations and leg swelling.  Gastrointestinal: Negative for nausea, vomiting, abdominal pain, diarrhea and constipation.  Genitourinary: Negative for dysuria and urgency.  Musculoskeletal: Negative for myalgias, back pain and neck pain.  Neurological: Negative for dizziness, sensory change, speech change, focal weakness, seizures and headaches.  Psychiatric/Behavioral: Negative for depression. The patient is nervous/anxious.     DRUG ALLERGIES:   Allergies  Allergen Reactions  . Cats Claw [Uncaria Tomentosa (Cats Claw)]   . Contrast Media [Iodinated Diagnostic Agents]   . Dye Fdc Red [Red Dye]   . Fish Allergy   . Peanut-Containing Drug Products     VITALS:  Blood pressure 129/74, pulse 82, temperature 97.9 F (36.6 C), temperature source Oral, resp. rate 23, height 6' (1.829 m), weight 96.48 kg (212 lb 11.2 oz), SpO2 98 %.  PHYSICAL EXAMINATION:  Physical Exam  GENERAL: 55 y.o.-year-old patient lying in the bed with no acute  distress.  EYES: Pupils equal, round, reactive to light and accommodation. No scleral icterus. Extraocular muscles intact.  HEENT: Head atraumatic, normocephalic. Oropharynx and nasopharynx clear.  NECK: Supple, no jugular venous distention. No thyroid enlargement, no tenderness.  LUNGS: Normal breath sounds bilaterally, no wheezing, rales,rhonchi or crepitation. No use of accessory muscles of respiration.  CARDIOVASCULAR: S1, S2 normal. No murmurs, rubs, or gallops. Some musculoskeletal tenderness along left sternal border ABDOMEN: Soft, nontender, nondistended. Bowel sounds present. No organomegaly or mass.  EXTREMITIES: No pedal edema, cyanosis, or clubbing.  NEUROLOGIC: Cranial nerves II through XII are intact. Muscle strength 5/5 in all extremities. Sensation intact. Gait not checked.  PSYCHIATRIC: The patient is alert and oriented x 3.  SKIN: No obvious rash, lesion, or ulcer.    LABORATORY PANEL:   CBC  Recent Labs Lab 10/19/15 0200  WBC 8.4  HGB 14.0  HCT 42.1  PLT 163   ------------------------------------------------------------------------------------------------------------------  Chemistries   Recent Labs Lab 10/18/15 1000 10/19/15 0200  NA 136  --   K 4.4  --   CL 102  --   CO2 24  --   GLUCOSE 119*  --   BUN 14  --   CREATININE 1.28*  --   CALCIUM 9.3  --   MG  --  1.9   ------------------------------------------------------------------------------------------------------------------  Cardiac Enzymes  Recent Labs Lab 10/19/15 0200  TROPONINI 0.03   ------------------------------------------------------------------------------------------------------------------  RADIOLOGY:  Dg Chest 2 View  10/18/2015  CLINICAL DATA:  Left-sided chest pain radiating into left upper extremity EXAM: CHEST  2 VIEW COMPARISON:  July 14, 2008 FINDINGS: There is slight scarring in the left lower lobe. Lungs elsewhere  are clear. Heart is upper normal in size  with pulmonary vascularity within normal limits. No adenopathy. Patient is status post coronary artery bypass grafting. There are surgical clips in the posterior mid abdomen. IMPRESSION: Status post coronary artery bypass grafting. Mild scarring left lower lobe. No edema or consolidation. Electronically Signed   By: Tommy BangWilliam  Woodruff Kelly M.D.   On: 10/18/2015 10:17    EKG:   Orders placed or performed during the hospital encounter of 10/18/15  . EKG 12-Lead  . EKG 12-Lead  . ED EKG within 10 minutes  . ED EKG within 10 minutes  . ED EKG  . ED EKG    ASSESSMENT AND PLAN:   Tommy Kelly is a 55 y.o. male with a known history of CAD status post CABG and multiple stents several years ago, hypertension and hyperlipidemia presents to hospital secondary to chest pressure that started this morning.  #1 Unstable angina- risk candidate. Prior h/o CAD with CABG and stents. - Status post cardiac catheterization today revealing 3 vessel disease, patent grafts. Medical management recommended. -Discontinue heparin and nitroglycerin drips -Troponins are stable. -2-D echocardiogram with LV dysfunction and EF of 35-40%. Appreciate cardiology consult- -continue on aspirin, statin, Plavix, and losartan. Metoprolol changed to Coreg. Explained about being compliant with medications  #2 CKD- s/p right nephrectomy several years ago Stable, monitor after cardiac cath  #3 HTN- on metoprolol, norvasc and losartan  #4 Chronic combined CHF- EF 35-40% On medical mgmt  #5 DVT Prophylaxis- start lovenox  Possible discharge today Added meds for chest pain- musculoskeletal     All the records are reviewed and case discussed with Care Management/Social Workerr. Management plans discussed with the patient, family and they are in agreement.  CODE STATUS: Full code  TOTAL TIME TAKING CARE OF THIS PATIENT: 37 minutes.   POSSIBLE D/C IN 1-2 DAYS, DEPENDING ON CLINICAL CONDITION.   Enid BaasKALISETTI,Marijayne Rauth M.D  on 10/19/2015 at 2:55 PM  Between 7am to 6pm - Pager - [phone]  After 6pm go to [email or web address] - password [email or web address] [facility]  [facility]  Office  [phone]  CC: Primary care physician; Julien Nordmannimothy Gollan, MD

## 2015-10-19 NOTE — Progress Notes (Signed)
Patient resting in room status post left heart cath through Left wrist site WDL no signs of bleeding or hematoma noted.

## 2015-10-19 NOTE — Progress Notes (Signed)
Patient is awake and alert.  Seems comfortable.  States chest pain is at a 4 out of 10.  Pressure type pain and constant. Heparin and nitro drips continue.  Card cath scheduled for 1030. Patient is aware

## 2015-10-19 NOTE — Progress Notes (Signed)
Report to jancey.  Keep right wrist elevated on pillow above the heart for today.  Watch right wrist for evidence of bleeding or hematoma.. If bleeding or hematoma noted, hold pressure over the site for at least 15 minutes and notify the physician.  No blending or flexing of the wrist--no lifting for the remainder of the day or for 2 weeks after your procedure. Notify the physician for evidence of infection or if you get a temperature. Nitro has been weaned off as ordered

## 2015-10-19 NOTE — Progress Notes (Signed)
ANTICOAGULATION CONSULT NOTE - Initial Consult  Pharmacy Consult for Heparin drip Indication: chest pain/ACS  Allergies  Allergen Reactions  . Cats Claw [Uncaria Tomentosa (Cats Claw)]   . Dye Fdc Red [Red Dye]   . Fish Allergy   . Peanut-Containing Drug Products     Patient Measurements: Height: 6' (182.9 cm) Weight: 212 lb 11.2 oz (96.48 kg) IBW/kg (Calculated) : 77.6 Heparin Dosing Weight: 95.3 kg  Vital Signs: Temp: 97.9 F (36.6 C) (06/15 0804) Temp Source: Oral (06/15 0804) BP: 134/80 mmHg (06/15 0804) Pulse Rate: 78 (06/15 0804)  Labs:  Recent Labs  10/18/15 1000  10/18/15 1534 10/18/15 1759 10/18/15 2051 10/19/15 0200 10/19/15 0821  HGB 16.1  --   --   --   --  14.0  --   HCT 47.5  --   --   --   --  42.1  --   PLT 212  --   --   --   --  163  --   APTT 31  --   --   --   --   --   --   LABPROT 14.3  --   --   --   --   --   --   INR 1.09  --   --   --   --   --   --   HEPARINUNFRC  --   --   --  0.24*  --  0.42 0.40  CREATININE 1.28*  --   --   --   --   --   --   TROPONINI 0.03  < > 0.04*  --  0.04* 0.03  --   < > = values in this interval not displayed.  Estimated Creatinine Clearance: 78.6 mL/min (by C-G formula based on Cr of 1.28).   Medical History: Past Medical History  Diagnosis Date  . Coronary artery disease     a. s/p CABG in 07/2007 w/ LIMA-LAD, SVG-dLAD, SVG-OM, and SVG-PDA.  Marland Kitchen Hyperlipidemia   . Hypertension   . Chronic kidney disease     a. 1991 s/p R nephrectomy   . Chronic combined systolic and diastolic CHF (congestive heart failure) (HCC)     a. Echo 07/2012: EF 45% w/ Grade 1 DD. Mild to moderate TR. PA Pressure 34.    Medications:  Scheduled:  . amLODipine  10 mg Oral Daily  . aspirin EC  81 mg Oral Daily  . atorvastatin  80 mg Oral q1800  . ciprofloxacin-dexamethasone  4 drop Left Ear BID  . clopidogrel  75 mg Oral Daily  . losartan  100 mg Oral Daily  . metoprolol tartrate  12.5 mg Oral BID  . sodium chloride  flush  3 mL Intravenous Q12H  . sodium chloride flush  3 mL Intravenous Q12H   Infusions:  . sodium chloride 75 mL/hr at 10/18/15 1900  . sodium chloride 1 mL/kg/hr (10/19/15 0720)  . heparin 1,300 Units/hr (10/19/15 0431)  . nitroGLYCERIN      Assessment: 55 yo male to start Heparin drip for chest pain,ACS/STEMI?. Patient on ASA/plavix at home. Hx of MI.  hgb 16.1  plt 212 INR 1.09 APTT  31  Goal of Therapy:  Heparin level 0.3-0.7 units/ml Monitor platelets by anticoagulation protocol: Yes   Plan:  Give 4000 units bolus x 1 Start heparin infusion at 1100 units/hr Check anti-Xa level in 1800 hours and daily while on heparin Continue to monitor H&H and platelets  6/14:  HL @ 18:00 = 0.24.   Will order heparin 1400 units IV X 1 bolus and increase drip rate to 1300 units/hr.  Will recheck HL 6 hrs after rate change on 6/15 @ 0130.   6/15 02:00 heparin level 0.42. Recheck in 6 hours to confirm. 6/15 0821 HL 0.40 Continue heparin drip at same rate. Pt to go to cath lab today. Follow up on plans after cardiac cath  Olene FlossMelissa D Jariel Drost, Pharm.D Clinical Pharmacist   10/19/2015,10:41 AM

## 2015-10-20 LAB — BASIC METABOLIC PANEL
ANION GAP: 8 (ref 5–15)
BUN: 19 mg/dL (ref 6–20)
CALCIUM: 8.7 mg/dL — AB (ref 8.9–10.3)
CO2: 25 mmol/L (ref 22–32)
CREATININE: 1.15 mg/dL (ref 0.61–1.24)
Chloride: 101 mmol/L (ref 101–111)
GFR calc Af Amer: >60 mL/min (ref >60)
GLUCOSE: 115 mg/dL — AB (ref 65–99)
Potassium: 4.4 mmol/L (ref 3.5–5.1)
Sodium: 134 mmol/L — ABNORMAL LOW (ref 135–145)

## 2015-10-20 MED ORDER — CARVEDILOL 6.25 MG PO TABS
6.2500 mg | ORAL_TABLET | Freq: Two times a day (BID) | ORAL | Status: DC
Start: 2015-10-20 — End: 2015-10-20
  Administered 2015-10-20: 6.25 mg via ORAL
  Filled 2015-10-20: qty 1

## 2015-10-20 MED ORDER — ASPIRIN 81 MG PO TABS: 81.0000 mg | ORAL_TABLET | Freq: Every day | ORAL | Status: DC

## 2015-10-20 MED ORDER — OXYCODONE-ACETAMINOPHEN 5-325 MG PO TABS: 1.0000 | ORAL_TABLET | Freq: Four times a day (QID) | ORAL | Status: AC | PRN

## 2015-10-20 MED ORDER — LOSARTAN POTASSIUM 100 MG PO TABS: 100.0000 mg | ORAL_TABLET | Freq: Every day | ORAL | Status: DC

## 2015-10-20 MED ORDER — ATORVASTATIN CALCIUM 80 MG PO TABS: 80.0000 mg | ORAL_TABLET | Freq: Every day | ORAL | Status: DC

## 2015-10-20 MED ORDER — CARVEDILOL 6.25 MG PO TABS: 6.2500 mg | ORAL_TABLET | Freq: Two times a day (BID) | ORAL | Status: DC

## 2015-10-20 NOTE — Care Management (Signed)
Faxed prescriptions to medication management clinic. Requested patient to complete application and CM would fax.

## 2015-10-20 NOTE — Progress Notes (Signed)
Patient: Tommy Kelly / Admit Date: 10/18/2015 / Date of Encounter: 10/20/2015, 8:16 AM   Subjective: Cardiac catheterization yesterday showed occluded native vessels with patent grafts. The patient's chest pain worsens with certain movements and it's reproducible by touch.  Review of Systems: Review of Systems  Constitutional: Positive for malaise/fatigue. Negative for fever, chills, weight loss and diaphoresis.  HENT: Negative for congestion.   Eyes: Negative for discharge and redness.  Respiratory: Negative for cough, hemoptysis, sputum production, shortness of breath and wheezing.   Cardiovascular: Positive for chest pain. Negative for palpitations, orthopnea, claudication, leg swelling and PND.  Gastrointestinal: Negative for nausea, vomiting and abdominal pain.  Musculoskeletal: Negative for falls.  Skin: Negative for rash.  Neurological: Negative for dizziness, sensory change, speech change, focal weakness, loss of consciousness and weakness.  Endo/Heme/Allergies: Does not bruise/bleed easily.  Psychiatric/Behavioral: The patient is nervous/anxious.   All other systems reviewed and are negative.   Objective: Telemetry: NSR, 70's, 4 beats NSVT Physical Exam: Blood pressure 133/72, pulse 77, temperature 97.6 F (36.4 C), temperature source Oral, resp. rate 16, height 6' (1.829 m), weight 212 lb 11.2 oz (96.48 kg), SpO2 96 %. Body mass index is 28.84 kg/(m^2). General: Well developed, well nourished, in no acute distress. Head: Normocephalic, atraumatic, sclera non-icteric, no xanthomas, nares are without discharge. Neck: Negative for carotid bruits. JVP not elevated. Lungs: Clear bilaterally to auscultation without wheezes, rales, or rhonchi. Breathing is unlabored. Heart: RRR S1 S2 without murmurs, rubs, or gallops. Chest pain is reproducible to palpation on exam.  Abdomen: Soft, non-tender, non-distended with normoactive bowel sounds. No rebound/guarding. Extremities:  No clubbing or cyanosis. No edema. Distal pedal pulses are 2+ and equal bilaterally. Neuro: Alert and oriented X 3. Moves all extremities spontaneously. Psych:  Responds to questions appropriately with a normal affect. Right radial pulse is normal with no hematoma   Intake/Output Summary (Last 24 hours) at 10/20/15 0816 Last data filed at 10/19/15 1914  Gross per 24 hour  Intake 393.75 ml  Output   1100 ml  Net -706.25 ml    Inpatient Medications:  . aspirin EC  81 mg Oral Daily  . atorvastatin  80 mg Oral q1800  . carvedilol  6.25 mg Oral BID WC  . ciprofloxacin-dexamethasone  4 drop Left Ear BID  . enoxaparin (LOVENOX) injection  40 mg Subcutaneous Q24H  . losartan  100 mg Oral Daily  . sodium chloride flush  3 mL Intravenous Q12H  . sodium chloride flush  3 mL Intravenous Q12H   Infusions:     Labs:  Recent Labs  10/18/15 1000 10/19/15 0200 10/20/15 0636  NA 136  --  134*  K 4.4  --  4.4  CL 102  --  101  CO2 24  --  25  GLUCOSE 119*  --  115*  BUN 14  --  19  CREATININE 1.28*  --  1.15  CALCIUM 9.3  --  8.7*  MG  --  1.9  --    No results for input(s): AST, ALT, ALKPHOS, BILITOT, PROT, ALBUMIN in the last 72 hours.  Recent Labs  10/18/15 1000 10/19/15 0200  WBC 5.8 8.4  HGB 16.1 14.0  HCT 47.5 42.1  MCV 82.0 83.8  PLT 212 163    Recent Labs  10/18/15 1250 10/18/15 1534 10/18/15 2051 10/19/15 0200  TROPONINI 0.04* 0.04* 0.04* 0.03   Invalid input(s): POCBNP  Recent Labs  10/18/15 1534  HGBA1C 5.7  Weights: Filed Weights   10/18/15 0957 10/18/15 1524  Weight: 210 lb (95.255 kg) 212 lb 11.2 oz (96.48 kg)     Radiology/Studies:  Dg Chest 2 View  10/18/2015  CLINICAL DATA:  Left-sided chest pain radiating into left upper extremity EXAM: CHEST  2 VIEW COMPARISON:  July 14, 2008 FINDINGS: There is slight scarring in the left lower lobe. Lungs elsewhere are clear. Heart is upper normal in size with pulmonary vascularity within normal  limits. No adenopathy. Patient is status post coronary artery bypass grafting. There are surgical clips in the posterior mid abdomen. IMPRESSION: Status post coronary artery bypass grafting. Mild scarring left lower lobe. No edema or consolidation. Electronically Signed   By: Bretta Bang III M.D.   On: 10/18/2015 10:17     Assessment and Plan  Principal Problem:   Unstable angina (HCC) Active Problems:   CAD, S/P CABG x 4 w/ LIMA-LAD, SVG-dLAD, SVG-OM, and SVG-PDA   Hyperlipidemia   Chest pain with moderate risk for cardiac etiology   CKD (chronic kidney disease) stage 2, GFR 60-89 ml/min   Accelerated hypertension    1.Chest pain : Likely musculoskeletal. Cardiac catheterization yesterday showed patent grafts. No further workup is recommended. I had a prolonged discussion with the patient about the importance of taking his cardiac medications. He reports inability to afford his medications. I recommend case manager consult to assist with this. -He is for cardiac cath this morning with Dr. Kirke Corin, MD (unable to undergo stress test 2/2 claustrophobia) - Continue low-dose aspirin, high dose atorvastatin and carvedilol. I discontinued Plavix.  2. Chronic systolic heart failure: Echocardiogram showed a drop in LV systolic function. Ejection fraction was 35-40%. In order to simplify his medications, I discontinued amlodipine and increase the dose of carvedilol. Continue losartan.  3. Essential hypertension: Blood pressure is more controlled now.   4. Hyperlipidemia: Continue atorvastatin.  5. CKD stage II/status post right nephrectomy in 1991: -Creatinine is stable   The patient can be discharged home from a cardiac standpoint but I recommend case manager consult to assist with his medications. Follow-up with Dr. Mariah Milling in 2-3 weeks.  Signed, Lorine Bears, MD   10/20/2015, 8:16 AM

## 2015-10-20 NOTE — Discharge Summary (Signed)
[facility] [place] [facility]   PATIENT NAME: Tommy Kelly    MR#:  [id number]  DATE OF BIRTH:  06/16/1960  DATE OF ADMISSION:  10/18/2015 ADMITTING PHYSICIAN: Enid Baasadhika Paco Cislo, MD  DATE OF DISCHARGE: 10/20/2015 12:28 PM  PRIMARY CARE PHYSICIAN: Julien Nordmannimothy Gollan, MD    ADMISSION DIAGNOSIS:  Unstable angina (HCC) [I20.0]  DISCHARGE DIAGNOSIS:  Principal Problem:   Unstable angina (HCC) Active Problems:   CAD, S/P CABG x 4 w/ LIMA-LAD, SVG-dLAD, SVG-OM, and SVG-PDA   Hyperlipidemia   Chest pain with moderate risk for cardiac etiology   CKD (chronic kidney disease) stage 2, GFR 60-89 ml/min   Accelerated hypertension   SECONDARY DIAGNOSIS:   Past Medical History  Diagnosis Date  . Coronary artery disease     a. s/p CABG in 07/2007 w/ LIMA-LAD, SVG-dLAD, SVG-OM, and SVG-PDA.  Marland Kitchen. Hyperlipidemia   . Hypertension   . Chronic kidney disease     a. 1991 s/p R nephrectomy   . Chronic combined systolic and diastolic CHF (congestive heart failure) (HCC)     a. Echo 07/2012: EF 45% w/ Grade 1 DD. Mild to moderate TR. PA Pressure 34.    HOSPITAL COURSE:   Tommy RungJimmy Kelly is a 55 y.o. male with a known history of CAD status post CABG and multiple stents several years ago, hypertension and hyperlipidemia presents to hospital secondary to chest pressure that started this morning.  #1 Unstable angina- high risk candidate. Prior h/o CAD with CABG and stents. - Status post cardiac catheterization revealing 3 vessel disease, patent grafts. Medical management recommended. -Troponins are stable. -2-D echocardiogram with LV dysfunction and EF of 35-40%. Appreciate cardiology consult- -continue on aspirin, statin,  and losartan. Metoprolol changed to Coreg. Explained about being compliant with medications - has some musculoskeletal chest pain- for which pain meds prescribed  #2 CKD- s/p right nephrectomy several years ago Stable after cardiac cath  #3 HTN- on  coreg and losartan  #4 Chronic combined CHF- EF 35-40% On medical mgmt  Discharge today.  DISCHARGE CONDITIONS:   Stable  CONSULTS OBTAINED:  Treatment Team:  Marykay Lexavid W Harding, MD  DRUG ALLERGIES:   Allergies  Allergen Reactions  . Cats Claw [Uncaria Tomentosa (Cats Claw)]   . Contrast Media [Iodinated Diagnostic Agents]   . Dye Fdc Red [Red Dye]   . Fish Allergy   . Peanut-Containing Drug Products     DISCHARGE MEDICATIONS:   Discharge Medication List as of 10/20/2015 11:15 AM    START taking these medications   Details  atorvastatin (LIPITOR) 80 MG tablet Take 1 tablet (80 mg total) by mouth daily at 6 PM., Starting 10/20/2015, Until Discontinued, Print    carvedilol (COREG) 6.25 MG tablet Take 1 tablet (6.25 mg total) by mouth 2 (two) times daily with a meal., Starting 10/20/2015, Until Discontinued, Print    oxyCODONE-acetaminophen (PERCOCET/ROXICET) 5-325 MG tablet Take 1-2 tablets by mouth every 6 (six) hours as needed for moderate pain or severe pain., Starting 10/20/2015, Until Discontinued, Print      CONTINUE these medications which have CHANGED   Details  aspirin 81 MG tablet Take 1 tablet (81 mg total) by mouth daily., Starting 10/20/2015, Until Discontinued, Print    losartan (COZAAR) 100 MG tablet Take 1 tablet (100 mg total) by mouth daily., Starting 10/20/2015, Until Discontinued, Print      STOP taking these medications     amLODipine (NORVASC) 10 MG tablet      clopidogrel (PLAVIX) 75  MG tablet      metoprolol tartrate (LOPRESSOR) 25 MG tablet      simvastatin (ZOCOR) 40 MG tablet      tadalafil (CIALIS) 5 MG tablet          DISCHARGE INSTRUCTIONS:   1. Advised to be compliant with medications 2. Cardiology f/u in 1-2 weeks   If you experience worsening of your admission symptoms, develop shortness of breath, life threatening emergency, suicidal or homicidal thoughts you must seek medical attention immediately by calling 911 or calling  your MD immediately  if symptoms less severe.  You Must read complete instructions/literature along with all the possible adverse reactions/side effects for all the Medicines you take and that have been prescribed to you. Take any new Medicines after you have completely understood and accept all the possible adverse reactions/side effects.   Please note  You were cared for by a hospitalist during your hospital stay. If you have any questions about your discharge medications or the care you received while you were in the hospital after you are discharged, you can call the unit and asked to speak with the hospitalist on call if the hospitalist that took care of you is not available. Once you are discharged, your primary care physician will handle any further medical issues. Please note that NO REFILLS for any discharge medications will be authorized once you are discharged, as it is imperative that you return to your primary care physician (or establish a relationship with a primary care physician if you do not have one) for your aftercare needs so that they can reassess your need for medications and monitor your lab values.    Today   CHIEF COMPLAINT:   Chief Complaint  Patient presents with  . Chest Pain  . Tachycardia    VITAL SIGNS:  Blood pressure 133/72, pulse 77, temperature 97.6 F (36.4 C), temperature source Oral, resp. rate 16, height 6' (1.829 m), weight 96.48 kg (212 lb 11.2 oz), SpO2 96 %.  I/O:   Intake/Output Summary (Last 24 hours) at 10/20/15 1421 Last data filed at 10/20/15 0900  Gross per 24 hour  Intake 633.75 ml  Output   1100 ml  Net -466.25 ml    PHYSICAL EXAMINATION:   Physical Exam  GENERAL: 55 y.o.-year-old patient lying in the bed with no acute distress.  EYES: Pupils equal, round, reactive to light and accommodation. No scleral icterus. Extraocular muscles intact.  HEENT: Head atraumatic, normocephalic. Oropharynx and nasopharynx clear.  NECK:  Supple, no jugular venous distention. No thyroid enlargement, no tenderness.  LUNGS: Normal breath sounds bilaterally, no wheezing, rales,rhonchi or crepitation. No use of accessory muscles of respiration.  CARDIOVASCULAR: S1, S2 normal. No murmurs, rubs, or gallops. Some musculoskeletal tenderness along left sternal border ABDOMEN: Soft, nontender, nondistended. Bowel sounds present. No organomegaly or mass.  EXTREMITIES: No pedal edema, cyanosis, or clubbing.  NEUROLOGIC: Cranial nerves II through XII are intact. Muscle strength 5/5 in all extremities. Sensation intact. Gait not checked.  PSYCHIATRIC: The patient is alert and oriented x 3.  SKIN: No obvious rash, lesion, or ulcer.   DATA REVIEW:   CBC  Recent Labs Lab 10/19/15 0200  WBC 8.4  HGB 14.0  HCT 42.1  PLT 163    Chemistries   Recent Labs Lab 10/19/15 0200 10/20/15 0636  NA  --  134*  K  --  4.4  CL  --  101  CO2  --  25  GLUCOSE  --  115*  BUN  --  19  CREATININE  --  1.15  CALCIUM  --  8.7*  MG 1.9  --     Cardiac Enzymes  Recent Labs Lab 10/19/15 0200  TROPONINI 0.03    Microbiology Results  No results found for this or any previous visit.  RADIOLOGY:  No results found.  EKG:   Orders placed or performed during the hospital encounter of 10/18/15  . EKG 12-Lead  . EKG 12-Lead  . ED EKG within 10 minutes  . ED EKG within 10 minutes  . ED EKG  . ED EKG      Management plans discussed with the patient, family and they are in agreement.  CODE STATUS:     Code Status Orders        Start     Ordered   10/18/15 1446  Full code   Continuous     10/18/15 1445    Code Status History    Date Active Date Inactive Code Status Order ID Comments User Context   This patient has a current code status but no historical code status.      TOTAL TIME TAKING CARE OF THIS PATIENT: 37 minutes.    Enid Baas M.D on 10/20/2015 at 2:21 PM  Between 7am to 6pm - Pager -  [phone]  After 6pm go to [email or web address] - password [email or web address] [facility]  [facility]  Office  [phone]  CC: Primary care physician; Julien Nordmann, MD

## 2015-11-02 ENCOUNTER — Ambulatory Visit: Payer: Medicare Other

## 2015-11-10 ENCOUNTER — Ambulatory Visit: Payer: Medicare Other

## 2015-12-07 ENCOUNTER — Ambulatory Visit: Payer: Medicare Other | Admitting: Cardiovascular Disease

## 2015-12-11 ENCOUNTER — Ambulatory Visit: Payer: Medicare Other

## 2015-12-29 ENCOUNTER — Ambulatory Visit: Payer: Medicare Other | Admitting: Nurse Practitioner

## 2015-12-29 ENCOUNTER — Encounter: Payer: Self-pay | Admitting: Nurse Practitioner

## 2015-12-29 ENCOUNTER — Encounter: Payer: Self-pay | Admitting: *Deleted

## 2015-12-29 DIAGNOSIS — I5042 Chronic combined systolic (congestive) and diastolic (congestive) heart failure: Secondary | ICD-10-CM | POA: Insufficient documentation

## 2015-12-29 DIAGNOSIS — I2510 Atherosclerotic heart disease of native coronary artery without angina pectoris: Secondary | ICD-10-CM | POA: Insufficient documentation

## 2015-12-29 DIAGNOSIS — I255 Ischemic cardiomyopathy: Secondary | ICD-10-CM | POA: Insufficient documentation

## 2015-12-29 DIAGNOSIS — N183 Chronic kidney disease, stage 3 unspecified: Secondary | ICD-10-CM | POA: Insufficient documentation

## 2016-02-09 ENCOUNTER — Ambulatory Visit: Payer: Medicare Other | Admitting: Cardiovascular Disease

## 2016-02-12 ENCOUNTER — Encounter: Payer: Medicare Other | Admitting: Pharmacist

## 2016-02-19 ENCOUNTER — Encounter: Payer: Medicare Other | Admitting: Pharmacist

## 2016-02-28 ENCOUNTER — Ambulatory Visit: Payer: Medicare Other | Admitting: Cardiovascular Disease

## 2016-03-21 ENCOUNTER — Ambulatory Visit: Payer: Medicare Other | Admitting: Cardiovascular Disease

## 2016-04-01 ENCOUNTER — Telehealth: Payer: Self-pay | Admitting: Cardiovascular Disease

## 2016-04-01 NOTE — Telephone Encounter (Signed)
°*  STAT* If patient is at the pharmacy, call can be transferred to refill team.   1. Which medications need to be refilled? (please list name of each medication and dose if known)   ASA 81 mg po q day   Atorvastatin 80 mg po q day   Carvedilol 6.25 mg po BID  Losartan 100 mg po q day          2. Which pharmacy/location (including street and city if local pharmacy) is medication to be sent to? [facility] management clinic   3. Do they need a 30 day or 90 day supply? 90  PATIENT HAS BEEN OUT FOR ATLEAST 2 WEEKS

## 2016-04-02 ENCOUNTER — Ambulatory Visit: Payer: Medicare Other | Admitting: Cardiovascular Disease

## 2016-04-02 NOTE — Telephone Encounter (Signed)
Pt requesting 90 day refill pt has not been seen since 2015. Pt does have future appointment with Dr. Mariah MillingGollan. Please advise if ok to refill?

## 2016-04-02 NOTE — Telephone Encounter (Signed)
Pt has cancelled 21 appts since his last ov in 2015.

## 2016-04-04 NOTE — Telephone Encounter (Signed)
Would renew medications He had catheterization June 2017 Would stress compliance with his medications as well as follow-up appointments

## 2016-04-05 ENCOUNTER — Other Ambulatory Visit: Payer: Self-pay | Admitting: *Deleted

## 2016-04-05 MED ORDER — LOSARTAN POTASSIUM 100 MG PO TABS: 100.0000 mg | ORAL_TABLET | Freq: Every day | ORAL | 0 refills | Status: AC

## 2016-04-05 MED ORDER — ASPIRIN 81 MG PO TABS: 81.0000 mg | ORAL_TABLET | Freq: Every day | ORAL | 0 refills | Status: AC

## 2016-04-05 MED ORDER — CARVEDILOL 6.25 MG PO TABS: 6.2500 mg | ORAL_TABLET | Freq: Two times a day (BID) | ORAL | 0 refills | Status: AC

## 2016-04-05 MED ORDER — ATORVASTATIN CALCIUM 80 MG PO TABS: 80.0000 mg | ORAL_TABLET | Freq: Every day | ORAL | 0 refills | Status: AC

## 2016-04-05 NOTE — Telephone Encounter (Signed)
Requested Prescriptions   Signed Prescriptions Disp Refills  . atorvastatin (LIPITOR) 80 MG tablet 90 tablet 0    Sig: Take 1 tablet (80 mg total) by mouth daily at 6 PM.    Authorizing Provider: Antonieta IbaGOLLAN, TIMOTHY J    Ordering User: Shawnie DapperLOPEZ, MARINA C  . carvedilol (COREG) 6.25 MG tablet 180 tablet 0    Sig: Take 1 tablet (6.25 mg total) by mouth 2 (two) times daily with a meal.    Authorizing Provider: Antonieta IbaGOLLAN, TIMOTHY J    Ordering User: LOPEZ, MARINA C  . losartan (COZAAR) 100 MG tablet 90 tablet 0    Sig: Take 1 tablet (100 mg total) by mouth daily.    Authorizing Provider: Antonieta IbaGOLLAN, TIMOTHY J    Ordering User: Iverson AlaminLOPEZ, MARINA C  . aspirin 81 MG tablet 90 tablet 0    Sig: Take 1 tablet (81 mg total) by mouth daily.    Authorizing Provider: Antonieta IbaGOLLAN, TIMOTHY J    Ordering User: Kendrick FriesLOPEZ, MARINA C

## 2016-04-05 NOTE — Telephone Encounter (Signed)
I have sent in refills to requested Pharmacy. I contacted pt but there was no answer.  Unable to leave message due to no VM setup.

## 2016-04-05 NOTE — Telephone Encounter (Signed)
  Requested Prescriptions   Signed Prescriptions Disp Refills  . atorvastatin (LIPITOR) 80 MG tablet 90 tablet 0    Sig: Take 1 tablet (80 mg total) by mouth daily at 6 PM.    Authorizing Provider: Antonieta IbaGOLLAN, TIMOTHY J    Ordering User: Shawnie DapperLOPEZ, Sobia Karger C  . carvedilol (COREG) 6.25 MG tablet 180 tablet 0    Sig: Take 1 tablet (6.25 mg total) by mouth 2 (two) times daily with a meal.    Authorizing Provider: Antonieta IbaGOLLAN, TIMOTHY J    Ordering User: Blandon Offerdahl C  . losartan (COZAAR) 100 MG tablet 90 tablet 0    Sig: Take 1 tablet (100 mg total) by mouth daily.    Authorizing Provider: Antonieta IbaGOLLAN, TIMOTHY J    Ordering User: Iverson AlaminLOPEZ, Tarae Wooden C  . aspirin 81 MG tablet 90 tablet 0    Sig: Take 1 tablet (81 mg total) by mouth daily.    Authorizing Provider: Antonieta IbaGOLLAN, TIMOTHY J    Ordering User: Kendrick FriesLOPEZ, Claborn Janusz C

## 2016-04-19 ENCOUNTER — Ambulatory Visit: Payer: Medicare Other | Admitting: Cardiovascular Disease

## 2016-05-16 ENCOUNTER — Ambulatory Visit: Payer: Medicare Other | Admitting: Cardiovascular Disease

## 2016-06-04 ENCOUNTER — Ambulatory Visit: Payer: Medicare Other | Admitting: Cardiovascular Disease

## 2016-06-19 ENCOUNTER — Ambulatory Visit: Payer: Medicare Other | Admitting: Cardiovascular Disease

## 2016-06-25 ENCOUNTER — Encounter: Payer: Self-pay | Admitting: *Deleted

## 2016-07-23 ENCOUNTER — Ambulatory Visit: Payer: Medicare Other | Admitting: Family Medicine
